# Patient Record
Sex: Female | Born: 1955 | Race: Black or African American | Hispanic: No | Smoking: Former smoker
Health system: Southern US, Community
[De-identification: ages and names within clinical notes are randomized; demographics above are authoritative.]

## PROBLEM LIST (undated history)

## (undated) DIAGNOSIS — I10 Essential (primary) hypertension: Secondary | ICD-10-CM

## (undated) DIAGNOSIS — G5600 Carpal tunnel syndrome, unspecified upper limb: Secondary | ICD-10-CM

## (undated) DIAGNOSIS — B379 Candidiasis, unspecified: Secondary | ICD-10-CM

## (undated) DIAGNOSIS — M1990 Unspecified osteoarthritis, unspecified site: Secondary | ICD-10-CM

## (undated) DIAGNOSIS — E7800 Pure hypercholesterolemia, unspecified: Secondary | ICD-10-CM

## (undated) HISTORY — PX: ECTOPIC PREGNANCY SURGERY: SHX613

## (undated) HISTORY — DX: Unspecified osteoarthritis, unspecified site: M19.90

## (undated) HISTORY — DX: Pure hypercholesterolemia, unspecified: E78.00

## (undated) HISTORY — DX: Carpal tunnel syndrome, unspecified upper limb: G56.00

---

## 1998-01-22 ENCOUNTER — Emergency Department (HOSPITAL_COMMUNITY): Admission: EM | Admit: 1998-01-22 | Discharge: 1998-01-22 | Payer: Self-pay | Admitting: Emergency Medicine

## 2001-10-13 ENCOUNTER — Encounter: Payer: Self-pay | Admitting: Emergency Medicine

## 2001-10-13 ENCOUNTER — Emergency Department (HOSPITAL_COMMUNITY): Admission: EM | Admit: 2001-10-13 | Discharge: 2001-10-13 | Payer: Self-pay | Admitting: Emergency Medicine

## 2003-05-13 ENCOUNTER — Emergency Department (HOSPITAL_COMMUNITY): Admission: EM | Admit: 2003-05-13 | Discharge: 2003-05-13 | Payer: Self-pay

## 2004-09-11 ENCOUNTER — Emergency Department (HOSPITAL_COMMUNITY): Admission: EM | Admit: 2004-09-11 | Discharge: 2004-09-11 | Payer: Self-pay | Admitting: Emergency Medicine

## 2005-01-13 ENCOUNTER — Emergency Department (HOSPITAL_COMMUNITY): Admission: EM | Admit: 2005-01-13 | Discharge: 2005-01-13 | Payer: Self-pay | Admitting: Emergency Medicine

## 2005-06-12 ENCOUNTER — Emergency Department (HOSPITAL_COMMUNITY): Admission: EM | Admit: 2005-06-12 | Discharge: 2005-06-12 | Payer: Self-pay | Admitting: Family Medicine

## 2008-06-21 ENCOUNTER — Emergency Department (HOSPITAL_COMMUNITY): Admission: EM | Admit: 2008-06-21 | Discharge: 2008-06-21 | Payer: Self-pay | Admitting: Emergency Medicine

## 2009-04-12 ENCOUNTER — Ambulatory Visit: Payer: Self-pay | Admitting: Family Medicine

## 2009-04-12 LAB — CONVERTED CEMR LAB
ALT: 15 units/L (ref 0–35)
AST: 17 units/L (ref 0–37)
Albumin: 4.5 g/dL (ref 3.5–5.2)
Alkaline Phosphatase: 66 units/L (ref 39–117)
BUN: 16 mg/dL (ref 6–23)
Basophils Absolute: 0 10*3/uL (ref 0.0–0.1)
Basophils Relative: 0 % (ref 0–1)
CO2: 26 meq/L (ref 19–32)
Calcium: 9.8 mg/dL (ref 8.4–10.5)
Chlamydia, Swab/Urine, PCR: NEGATIVE
Chloride: 103 meq/L (ref 96–112)
Creatinine, Ser: 0.94 mg/dL (ref 0.40–1.20)
Eosinophils Absolute: 0.1 10*3/uL (ref 0.0–0.7)
Eosinophils Relative: 2 % (ref 0–5)
GC Probe Amp, Urine: NEGATIVE
Glucose, Bld: 85 mg/dL (ref 70–99)
HCT: 40.3 % (ref 36.0–46.0)
Hemoglobin: 13.6 g/dL (ref 12.0–15.0)
Lymphocytes Relative: 47 % — ABNORMAL HIGH (ref 12–46)
Lymphs Abs: 2.6 10*3/uL (ref 0.7–4.0)
MCHC: 33.7 g/dL (ref 30.0–36.0)
MCV: 84 fL (ref 78.0–100.0)
Monocytes Absolute: 0.4 10*3/uL (ref 0.1–1.0)
Monocytes Relative: 8 % (ref 3–12)
Neutro Abs: 2.4 10*3/uL (ref 1.7–7.7)
Neutrophils Relative %: 43 % (ref 43–77)
Platelets: 247 10*3/uL (ref 150–400)
Potassium: 4.1 meq/L (ref 3.5–5.3)
RBC: 4.8 M/uL (ref 3.87–5.11)
RDW: 12.9 % (ref 11.5–15.5)
Sed Rate: 25 mm/hr — ABNORMAL HIGH (ref 0–22)
Sodium: 141 meq/L (ref 135–145)
TSH: 1.139 microintl units/mL (ref 0.350–4.500)
Total Bilirubin: 0.3 mg/dL (ref 0.3–1.2)
Total Protein: 7.6 g/dL (ref 6.0–8.3)
WBC: 5.4 10*3/uL (ref 4.0–10.5)

## 2009-04-19 ENCOUNTER — Ambulatory Visit: Payer: Self-pay | Admitting: Internal Medicine

## 2009-04-19 ENCOUNTER — Encounter (INDEPENDENT_AMBULATORY_CARE_PROVIDER_SITE_OTHER): Payer: Self-pay | Admitting: Family Medicine

## 2009-04-19 ENCOUNTER — Ambulatory Visit (HOSPITAL_COMMUNITY): Admission: RE | Admit: 2009-04-19 | Discharge: 2009-04-19 | Payer: Self-pay | Admitting: Family Medicine

## 2009-04-19 LAB — CONVERTED CEMR LAB
Cholesterol: 238 mg/dL — ABNORMAL HIGH (ref 0–200)
HDL: 46 mg/dL (ref >39)
LDL Cholesterol: 173 mg/dL — ABNORMAL HIGH (ref 0–99)
Total CHOL/HDL Ratio: 5.2
Triglycerides: 93 mg/dL (ref <150)
VLDL: 19 mg/dL (ref 0–40)

## 2009-04-29 ENCOUNTER — Ambulatory Visit: Payer: Self-pay | Admitting: Internal Medicine

## 2009-05-23 ENCOUNTER — Ambulatory Visit: Payer: Self-pay | Admitting: Family Medicine

## 2009-05-23 LAB — CONVERTED CEMR LAB
BUN: 16 mg/dL (ref 6–23)
CO2: 26 meq/L (ref 19–32)
Calcium: 10.2 mg/dL (ref 8.4–10.5)
Chloride: 100 meq/L (ref 96–112)
Creatinine, Ser: 0.94 mg/dL (ref 0.40–1.20)
Glucose, Bld: 114 mg/dL — ABNORMAL HIGH (ref 70–99)
Potassium: 3.5 meq/L (ref 3.5–5.3)
Sodium: 139 meq/L (ref 135–145)

## 2009-06-15 ENCOUNTER — Emergency Department (HOSPITAL_COMMUNITY): Admission: EM | Admit: 2009-06-15 | Discharge: 2009-06-16 | Payer: Self-pay | Admitting: Emergency Medicine

## 2009-06-27 ENCOUNTER — Encounter (INDEPENDENT_AMBULATORY_CARE_PROVIDER_SITE_OTHER): Payer: Self-pay | Admitting: Family Medicine

## 2009-06-27 ENCOUNTER — Ambulatory Visit: Payer: Self-pay | Admitting: Family Medicine

## 2009-06-27 LAB — CONVERTED CEMR LAB
BUN: 14 mg/dL (ref 6–23)
CO2: 24 meq/L (ref 19–32)
Calcium: 9.9 mg/dL (ref 8.4–10.5)
Chloride: 103 meq/L (ref 96–112)
Creatinine, Ser: 1.17 mg/dL (ref 0.40–1.20)
Glucose, Bld: 110 mg/dL — ABNORMAL HIGH (ref 70–99)
Potassium: 3.7 meq/L (ref 3.5–5.3)
Sodium: 138 meq/L (ref 135–145)

## 2009-07-21 ENCOUNTER — Ambulatory Visit: Payer: Self-pay | Admitting: Internal Medicine

## 2009-10-18 ENCOUNTER — Ambulatory Visit: Payer: Self-pay | Admitting: Internal Medicine

## 2009-11-09 ENCOUNTER — Ambulatory Visit: Payer: Self-pay | Admitting: Family Medicine

## 2010-04-20 ENCOUNTER — Ambulatory Visit (HOSPITAL_COMMUNITY)
Admission: RE | Admit: 2010-04-20 | Discharge: 2010-04-20 | Payer: Self-pay | Source: Home / Self Care | Attending: Internal Medicine | Admitting: Internal Medicine

## 2010-07-02 LAB — POCT I-STAT, CHEM 8
BUN: 16 mg/dL (ref 6–23)
Calcium, Ion: 1.1 mmol/L — ABNORMAL LOW (ref 1.12–1.32)
Chloride: 101 mEq/L (ref 96–112)
Creatinine, Ser: 1 mg/dL (ref 0.4–1.2)
Glucose, Bld: 114 mg/dL — ABNORMAL HIGH (ref 70–99)
HCT: 40 % (ref 36.0–46.0)
Hemoglobin: 13.6 g/dL (ref 12.0–15.0)
Potassium: 3.5 mEq/L (ref 3.5–5.1)
Sodium: 139 mEq/L (ref 135–145)
TCO2: 30 mmol/L (ref 0–100)

## 2010-07-02 LAB — CBC
HCT: 38.8 % (ref 36.0–46.0)
Hemoglobin: 13.3 g/dL (ref 12.0–15.0)
MCHC: 34.3 g/dL (ref 30.0–36.0)
MCV: 85.3 fL (ref 78.0–100.0)
Platelets: 207 10*3/uL (ref 150–400)
RBC: 4.55 MIL/uL (ref 3.87–5.11)
RDW: 12.5 % (ref 11.5–15.5)
WBC: 7.2 10*3/uL (ref 4.0–10.5)

## 2010-07-02 LAB — DIFFERENTIAL
Basophils Absolute: 0 10*3/uL (ref 0.0–0.1)
Basophils Relative: 1 % (ref 0–1)
Eosinophils Absolute: 0.1 10*3/uL (ref 0.0–0.7)
Eosinophils Relative: 2 % (ref 0–5)
Lymphocytes Relative: 38 % (ref 12–46)
Lymphs Abs: 2.7 10*3/uL (ref 0.7–4.0)
Monocytes Absolute: 0.7 10*3/uL (ref 0.1–1.0)
Monocytes Relative: 10 % (ref 3–12)
Neutro Abs: 3.6 10*3/uL (ref 1.7–7.7)
Neutrophils Relative %: 50 % (ref 43–77)

## 2010-10-07 ENCOUNTER — Inpatient Hospital Stay (INDEPENDENT_AMBULATORY_CARE_PROVIDER_SITE_OTHER)
Admission: RE | Admit: 2010-10-07 | Discharge: 2010-10-07 | Disposition: A | Discharge status: Home / Self Care | Payer: Self-pay | Source: Ambulatory Visit | Attending: Family Medicine | Admitting: Family Medicine

## 2010-10-07 DIAGNOSIS — J069 Acute upper respiratory infection, unspecified: Secondary | ICD-10-CM

## 2011-01-29 ENCOUNTER — Other Ambulatory Visit: Payer: Self-pay | Admitting: Family Medicine

## 2011-04-19 ENCOUNTER — Other Ambulatory Visit (HOSPITAL_COMMUNITY): Payer: Self-pay | Admitting: Family Medicine

## 2011-04-19 DIAGNOSIS — Z1231 Encounter for screening mammogram for malignant neoplasm of breast: Secondary | ICD-10-CM

## 2011-05-18 ENCOUNTER — Ambulatory Visit (HOSPITAL_COMMUNITY)
Admission: RE | Admit: 2011-05-18 | Discharge: 2011-05-18 | Disposition: A | Discharge status: Home / Self Care | Payer: Self-pay | Source: Ambulatory Visit | Attending: Family Medicine | Admitting: Family Medicine

## 2011-05-18 DIAGNOSIS — Z1231 Encounter for screening mammogram for malignant neoplasm of breast: Secondary | ICD-10-CM | POA: Insufficient documentation

## 2011-05-30 ENCOUNTER — Emergency Department (HOSPITAL_COMMUNITY)
Admission: EM | Admit: 2011-05-30 | Discharge: 2011-05-30 | Disposition: A | Discharge status: Home / Self Care | Payer: Self-pay | Source: Home / Self Care | Attending: Emergency Medicine | Admitting: Emergency Medicine

## 2011-05-30 ENCOUNTER — Encounter (HOSPITAL_COMMUNITY): Payer: Self-pay | Admitting: *Deleted

## 2011-05-30 DIAGNOSIS — N760 Acute vaginitis: Secondary | ICD-10-CM

## 2011-05-30 HISTORY — DX: Candidiasis, unspecified: B37.9

## 2011-05-30 HISTORY — DX: Essential (primary) hypertension: I10

## 2011-05-30 LAB — WET PREP, GENITAL: Trich, Wet Prep: NONE SEEN

## 2011-05-30 LAB — POCT URINALYSIS DIP (DEVICE)
Bilirubin Urine: NEGATIVE
Glucose, UA: NEGATIVE mg/dL
Hgb urine dipstick: NEGATIVE
Ketones, ur: NEGATIVE mg/dL
Nitrite: NEGATIVE
Protein, ur: NEGATIVE mg/dL
Specific Gravity, Urine: 1.01 (ref 1.005–1.030)
Urobilinogen, UA: 0.2 mg/dL (ref 0.0–1.0)
pH: 7 (ref 5.0–8.0)

## 2011-05-30 MED ORDER — METRONIDAZOLE 500 MG PO TABS: 500.0000 mg | ORAL_TABLET | Freq: Two times a day (BID) | ORAL | Status: AC

## 2011-05-30 MED ORDER — FLUCONAZOLE 150 MG PO TABS: ORAL_TABLET | ORAL | Status: AC

## 2011-05-30 NOTE — ED Notes (Signed)
Pt  reportts  Symptoms  Of a  Vaginal  Discharge  With itching  And      Low  abd  Pressure  As  Well  - pt  Reports  The  Symptoms   For   Several  Days  -  The pt is  Ambulatory upright  With a  Steady  Fluid  Gait

## 2011-05-30 NOTE — Discharge Instructions (Signed)
Take the medication as written. Give [place] a working phone number so that we can contact you if needed. Refrain from sexual contact until you know your results and your partner(s) are treated. Return if you get worse, have a fever >100.4, or for any concerns. Make sure you take your blood pressure medication regularly. Fill it tomorrow. Uncontrolled hypertension dramatically increases your risk of stroke, heart attack, eye, and kidney damage. Go immediately to the ER if you have chest pain, a headache, trouble breathing, if you feel like you are about to pass out, if you do pass out, or for any other concerns.

## 2011-05-30 NOTE — ED Provider Notes (Signed)
History     CSN: [id number]  Arrival date & time 05/30/11  1836   First MD Initiated Contact with Patient 05/30/11 1910      No chief complaint on file.   (Consider location/radiation/quality/duration/timing/severity/associated sxs/prior treatment) HPI Comments: Patient with vaginal itching, nonodorous, thick vaginal discharge starting several days ago. Reports dysuria, but No urinary urgency, frequency, hematuria, oderous or cloudy urine. No genital blisters, nausea, vomiting, fevers, back pain. She reports intermittent nonradiating, lower midline, abdominal pressure after urinating. No recent antibiotic use. Patient is sexually active- had intercourse with her husband last week, from whom she is separated. Did not use protection. Has a history of yeast infections, and a history of an unknown sexually transmitted disease. No history of BV, trichomoniasis, herpes, syphilis, HIV. Previous labs negative for gonorrhea or Chlamydia.  Patient is a 56 y.o. female presenting with vaginal discharge. The history is provided by the patient.  Vaginal Discharge This is a new problem. The current episode started more than 2 days ago. The problem occurs constantly. The problem has not changed since onset.Pertinent negatives include no chest pain and no abdominal pain. The symptoms are aggravated by nothing. The symptoms are relieved by nothing. She has tried nothing for the symptoms. The treatment provided no relief.    No past medical history on file.  No past surgical history on file.  No family history on file.  History  Substance Use Topics  . Smoking status: Not on file  . Smokeless tobacco: Not on file  . Alcohol Use: Not on file    OB History    No data available      Review of Systems  Constitutional: Negative for fever.  HENT: Negative for sore throat and mouth sores.   Eyes: Negative for redness.  Respiratory: Negative for cough.   Cardiovascular: Negative for chest pain.    Gastrointestinal: Negative for nausea, vomiting and abdominal pain.  Genitourinary: Positive for vaginal discharge. Negative for dysuria, urgency, frequency, hematuria, flank pain, vaginal bleeding and vaginal pain.  Musculoskeletal: Negative for back pain.  Skin: Negative for rash.    Allergies  Review of patient's allergies indicates not on file.  Home Medications  No current outpatient prescriptions on file.  There were no vitals taken for this visit.  Physical Exam  Nursing note and vitals reviewed. Constitutional: She is oriented to person, place, and time. She appears well-developed and well-nourished. No distress.  HENT:  Head: Normocephalic and atraumatic.  Eyes: EOM are normal.  Neck: Normal range of motion.  Cardiovascular: Normal rate.   Pulmonary/Chest: Effort normal.  Abdominal: Soft. Bowel sounds are normal. She exhibits no distension. There is no tenderness. There is no rebound and no guarding.  Genitourinary: Uterus normal. Pelvic exam was performed with patient supine. There is no rash on the right labia. There is no rash on the left labia. Uterus is not tender. Cervix exhibits no motion tenderness and no friability. Right adnexum displays no mass, no tenderness and no fullness. Left adnexum displays no mass, no tenderness and no fullness. No erythema, tenderness or bleeding around the vagina. No foreign body around the vagina. Vaginal discharge found.       Thin white nonoderous  vaginal d/c.Chaperone present during exam  Musculoskeletal: Normal range of motion.  Neurological: She is alert and oriented to person, place, and time.  Skin: Skin is warm and dry.  Psychiatric: She has a normal mood and affect. Her behavior is normal. Judgment and thought content normal.  ED Course  Procedures (including critical care time)  Labs Reviewed - No data to display No results found.   No diagnosis found.  Results for orders placed during the hospital encounter of  05/30/11  POCT URINALYSIS DIP (DEVICE)      Component Value Range   Glucose, UA NEGATIVE  NEGATIVE (mg/dL)   Bilirubin Urine NEGATIVE  NEGATIVE    Ketones, ur NEGATIVE  NEGATIVE (mg/dL)   Specific Gravity, Urine 1.010  1.005 - 1.030    Hgb urine dipstick NEGATIVE  NEGATIVE    pH 7.0  5.0 - 8.0    Protein, ur NEGATIVE  NEGATIVE (mg/dL)   Urobilinogen, UA 0.2  0.0 - 1.0 (mg/dL)   Nitrite NEGATIVE  NEGATIVE    Leukocytes, UA MODERATE (*) NEGATIVE      MDM  Previous labs reviewed. No history of gonorrhea Chlamydia. Udip noted. Feel that this is probably from the vaginal discharge.  Cell phone [phone]. Pt states to call this.   H&P most c/w BV vs trich. Sent off GC/chlamydia, wet prep, HIV, RPR. Will not treat empirically now. Will send home with flagyl, diflucan if her symptoms do not resolve with this.. Advised pt to refrain from sexual contact until she  knows lab results, symptoms resolve, and partner(s) are treated. Pt provided working phone number. Pt agrees.     Luiz Blare, MD 05/30/11 2251

## 2011-05-31 LAB — GC/CHLAMYDIA PROBE AMP, GENITAL
Chlamydia, DNA Probe: NEGATIVE
GC Probe Amp, Genital: NEGATIVE

## 2011-05-31 NOTE — ED Notes (Signed)
GC/Chlamydia neg., Wet prep: Few clue cells, yeast TNTC, many WBC's. Pt. adequately treated with Diflucan and Flagyl. Vassie Moselle 05/31/2011

## 2012-10-29 ENCOUNTER — Other Ambulatory Visit: Payer: Self-pay

## 2012-10-29 DIAGNOSIS — Z1231 Encounter for screening mammogram for malignant neoplasm of breast: Secondary | ICD-10-CM

## 2012-11-24 ENCOUNTER — Ambulatory Visit: Payer: Self-pay

## 2012-11-27 ENCOUNTER — Ambulatory Visit
Admission: RE | Admit: 2012-11-27 | Discharge: 2012-11-27 | Disposition: A | Discharge status: Home / Self Care | Payer: BC Managed Care – PPO | Source: Ambulatory Visit

## 2012-11-27 DIAGNOSIS — Z1231 Encounter for screening mammogram for malignant neoplasm of breast: Secondary | ICD-10-CM

## 2013-02-12 ENCOUNTER — Emergency Department (HOSPITAL_COMMUNITY)
Admission: EM | Admit: 2013-02-12 | Discharge: 2013-02-12 | Disposition: A | Discharge status: Home / Self Care | Payer: BC Managed Care – PPO | Source: Home / Self Care | Attending: Family Medicine | Admitting: Family Medicine

## 2013-02-12 ENCOUNTER — Encounter (HOSPITAL_COMMUNITY): Payer: Self-pay | Admitting: Emergency Medicine

## 2013-02-12 DIAGNOSIS — J069 Acute upper respiratory infection, unspecified: Secondary | ICD-10-CM

## 2013-02-12 LAB — POCT RAPID STREP A: Streptococcus, Group A Screen (Direct): NEGATIVE

## 2013-02-12 MED ORDER — IPRATROPIUM BROMIDE 0.03 % NA SOLN: 2.0000 | Freq: Two times a day (BID) | NASAL | Status: DC

## 2013-02-12 MED ORDER — AZITHROMYCIN 250 MG PO TABS: 250.0000 mg | ORAL_TABLET | Freq: Every day | ORAL | Status: DC

## 2013-02-12 MED ORDER — GUAIFENESIN-CODEINE 100-10 MG/5ML PO SOLN: 5.0000 mL | Freq: Three times a day (TID) | ORAL | Status: DC | PRN

## 2013-02-12 NOTE — ED Notes (Signed)
Pt c/o cold sxs onset yest Sxs include: chills, facial pressure, runny nose, congestion, ST Denies: SOB, wheezing, f/v/n/d Pt is alert w/no signs of acute distress.

## 2013-02-12 NOTE — Discharge Instructions (Signed)
Thank you for coming in today. Use the cough medication as needed. Use Atrovent nasal spray. Use ibuprofen for pain fever and sore throat. If not getting better take azithromycin. Call or go to the emergency room if you get worse, have trouble breathing, have chest pains, or palpitations.   Cough, Adult  A cough is a reflex that helps clear your throat and airways. It can help heal the body or may be a reaction to an irritated airway. A cough may only last 2 or 3 weeks (acute) or may last more than 8 weeks (chronic).  CAUSES Acute cough:  Viral or bacterial infections. Chronic cough:  Infections.  Allergies.  Asthma.  Post-nasal drip.  Smoking.  Heartburn or acid reflux.  Some medicines.  Chronic lung problems (COPD).  Cancer. SYMPTOMS   Cough.  Fever.  Chest pain.  Increased breathing rate.  High-pitched whistling sound when breathing (wheezing).  Colored mucus that you cough up (sputum). TREATMENT   A bacterial cough may be treated with antibiotic medicine.  A viral cough must run its course and will not respond to antibiotics.  Your caregiver may recommend other treatments if you have a chronic cough. HOME CARE INSTRUCTIONS   Only take over-the-counter or prescription medicines for pain, discomfort, or fever as directed by your caregiver. Use cough suppressants only as directed by your caregiver.  Use a cold steam vaporizer or humidifier in your bedroom or home to help loosen secretions.  Sleep in a semi-upright position if your cough is worse at night.  Rest as needed.  Stop smoking if you smoke. SEEK IMMEDIATE MEDICAL CARE IF:   You have pus in your sputum.  Your cough starts to worsen.  You cannot control your cough with suppressants and are losing sleep.  You begin coughing up blood.  You have difficulty breathing.  You develop pain which is getting worse or is uncontrolled with medicine.  You have a fever. MAKE SURE YOU:    Understand these instructions.  Will watch your condition.  Will get help right away if you are not doing well or get worse. Document Released: 09/22/2010 Document Revised: 06/18/2011 Document Reviewed: 09/22/2010 [facility] Patient Information 2014 [place], [place].

## 2013-02-12 NOTE — ED Provider Notes (Signed)
Autumn Stuart is a 57 y.o. female who presents to Urgent Care today for cough congestion runny nose present for 2 days. This is associated with some body aches mild sore throat. She's tried Alka-Seltzer which made her vomit. Aside from that one episode of vomiting she has not had any further nausea vomiting or diarrhea. She is currently being evaluated for rheumatoid arthritis by a rheumatologist. She's not currently taking any medications chronically aside from amlodipine. She's well otherwise   Past Medical History  Diagnosis Date  . Hypertension   . Yeast infection    History  Substance Use Topics  . Smoking status: Never Smoker   . Smokeless tobacco: Not on file  . Alcohol Use: No   ROS as above Medications reviewed. No current facility-administered medications for this encounter.   Current Outpatient Prescriptions  Medication Sig Dispense Refill  . amLODipine (NORVASC) 5 MG tablet Take 5 mg by mouth daily.      Marland Kitchen azithromycin (ZITHROMAX) 250 MG tablet Take 1 tablet (250 mg total) by mouth daily. Take first 2 tablets together, then 1 every day until finished.  6 tablet  0  . guaiFENesin-codeine 100-10 MG/5ML syrup Take 5 mLs by mouth 3 (three) times daily as needed for cough.  120 mL  0  . ipratropium (ATROVENT) 0.03 % nasal spray Place 2 sprays into the nose every 12 (twelve) hours.  30 mL  1    Exam:  BP 166/92  Pulse 83  Temp(Src) 98.5 F (36.9 C) (Oral)  Resp 18  SpO2 98% Gen: Well NAD HEENT: EOMI,  MMM, posterior pharynx with cobblestoning. Tympanic membranes are normal appearing bilaterally. Lungs: CTABL Nl WOB Heart: RRR no MRG Abd: NABS, NT, ND Exts: Non edematous BL  LE, warm and well perfused.   Results for orders placed during the hospital encounter of 02/12/13 (from the past 24 hour(s))  POCT RAPID STREP A ([facility] ONLY)     Status: None   Collection Time    02/12/13  3:00 PM      Result Value Range   Streptococcus, Group A Screen (Direct) NEGATIVE   NEGATIVE   No results found.  Assessment and Plan: 57 y.o. female with viral URI with postnasal drip. Plan for symptomatic management with Atrovent and codeine containing cough medication. Additional prescribe azithromycin if patient does not improve. Followup if not improving. Discussed warning signs or symptoms. Please see discharge instructions. Patient expresses understanding.      Rodolph Bong, MD 02/12/13 [phone]

## 2013-02-14 LAB — CULTURE, GROUP A STREP

## 2013-12-03 ENCOUNTER — Ambulatory Visit (INDEPENDENT_AMBULATORY_CARE_PROVIDER_SITE_OTHER): Payer: No Typology Code available for payment source | Admitting: Family Medicine

## 2013-12-03 ENCOUNTER — Ambulatory Visit (INDEPENDENT_AMBULATORY_CARE_PROVIDER_SITE_OTHER): Payer: No Typology Code available for payment source

## 2013-12-03 VITALS — BP 162/100 | HR 60 | Temp 98.1°F | Resp 16 | Ht 60.0 in | Wt 157.0 lb

## 2013-12-03 DIAGNOSIS — M545 Low back pain, unspecified: Secondary | ICD-10-CM

## 2013-12-03 DIAGNOSIS — M810 Age-related osteoporosis without current pathological fracture: Secondary | ICD-10-CM | POA: Insufficient documentation

## 2013-12-03 DIAGNOSIS — I10 Essential (primary) hypertension: Secondary | ICD-10-CM | POA: Insufficient documentation

## 2013-12-03 DIAGNOSIS — E785 Hyperlipidemia, unspecified: Secondary | ICD-10-CM | POA: Insufficient documentation

## 2013-12-03 DIAGNOSIS — M546 Pain in thoracic spine: Secondary | ICD-10-CM

## 2013-12-03 LAB — POCT CBC
Granulocyte percent: 63.3 %G (ref 37–80)
HCT, POC: 43 % (ref 37.7–47.9)
Hemoglobin: 13.7 g/dL (ref 12.2–16.2)
Lymph, poc: 1.8 (ref 0.6–3.4)
MCH, POC: 27.7 pg (ref 27–31.2)
MCHC: 31.7 g/dL — AB (ref 31.8–35.4)
MCV: 87.1 fL (ref 80–97)
MID (cbc): 0.2 (ref 0–0.9)
MPV: 8.8 fL (ref 0–99.8)
POC Granulocyte: 3.5 (ref 2–6.9)
POC LYMPH PERCENT: 32.2 %L (ref 10–50)
POC MID %: 4.5 %M (ref 0–12)
Platelet Count, POC: 235 10*3/uL (ref 142–424)
RBC: 4.94 M/uL (ref 4.04–5.48)
RDW, POC: 13.3 %
WBC: 5.5 10*3/uL (ref 4.6–10.2)

## 2013-12-03 LAB — POCT URINALYSIS DIPSTICK
Bilirubin, UA: NEGATIVE
Blood, UA: NEGATIVE
Glucose, UA: NEGATIVE
Ketones, UA: NEGATIVE
Leukocytes, UA: NEGATIVE
Nitrite, UA: NEGATIVE
Protein, UA: NEGATIVE
Spec Grav, UA: 1.015
Urobilinogen, UA: 0.2
pH, UA: 6.5

## 2013-12-03 LAB — POCT UA - MICROSCOPIC ONLY
Casts, Ur, LPF, POC: NEGATIVE
Crystals, Ur, HPF, POC: NEGATIVE
Mucus, UA: NEGATIVE
RBC, urine, microscopic: NEGATIVE
Yeast, UA: NEGATIVE

## 2013-12-03 MED ORDER — HYDROCODONE-ACETAMINOPHEN 5-325 MG PO TABS: 1.0000 | ORAL_TABLET | Freq: Four times a day (QID) | ORAL | Status: DC | PRN

## 2013-12-03 MED ORDER — PREDNISONE 20 MG PO TABS: 40.0000 mg | ORAL_TABLET | Freq: Every day | ORAL | Status: DC

## 2013-12-03 NOTE — Progress Notes (Addendum)
This chart was scribed for Robyn Haber, MD by Ladene Artist, ED Scribe. The patient was seen in room 3. Patient's care was started at 10:09 AM.  Patient ID: Autumn Stuart MRN: [id number], DOB: 05-Feb-1956, 58 y.o. Date of Encounter: 12/03/2013, 10:09 AM  Primary Physician: Becky Sax, MD  Chief Complaint  Patient presents with   Back Pain    x 4 days    HPI: 58 y.o. year old female with history below presents with gradually worsening of R-sided lower back pain over the past 4 days. Pt reports associated SOB last night, nausea, mild weakness in bilateral legs, substernal chest wall tenderess. She denies cough, emesis, abdominal pain, fever.  Pain is exacerbated with raising her R arm and with deep breaths. Pt states that she can not sleep, sit or stand for long periods of time due to pain. She denies fall or injury. Pt has tried applying an ice pack, heating pad, taking Motrin and Gas-X with no relief.   Patient was diagnosed with osteoporosis last week. She started on Fosamax and vitamin D. 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   Past Medical History  Diagnosis Date   Hypertension    Yeast infection    Arthritis    High cholesterol      Home Meds: Prior to Admission medications   Medication Sig Start Date End Date Taking? Authorizing Provider  alendronate (FOSAMAX) 70 MG tablet Take 70 mg by mouth once a week. Take with a full glass of water on an empty stomach.   Yes Historical Provider, MD  diclofenac sodium (VOLTAREN) 1 % GEL Apply topically 4 (four) times daily.   Yes Historical Provider, MD  rosuvastatin (CRESTOR) 10 MG tablet Take 10 mg by mouth daily.   Yes Historical Provider, MD  amLODipine (NORVASC) 5 MG tablet Take 5 mg by mouth daily.    Historical Provider, MD  ipratropium (ATROVENT) 0.03 % nasal spray Place 2 sprays into the nose every 12 (twelve) hours. 02/12/13   Gregor Hams, MD     Allergies: No Known Allergies  History   Social History   Marital Status: Legally Separated    Spouse Name: N/A    Number of Children: N/A   Years of Education: N/A   Occupational History   Not on file.   Social History Main Topics   Smoking status: Never Smoker    Smokeless tobacco: Not on file   Alcohol Use: No   Drug Use: No   Sexual Activity: Not on file   Other Topics Concern   Not on file   Social History Narrative   No narrative on file     Review of Systems: Constitutional: negative for chills, fever, night sweats, weight changes, or fatigue  HEENT: negative for vision changes, hearing loss, congestion, rhinorrhea, ST, epistaxis, or sinus pressure Cardiovascular: negative for chest pain or palpitations Respiratory: negative for hemoptysis, wheezing or cough, +shortness of breath Abdominal: negative for abdominal pain, vomiting, diarrhea, or constipation, +nausea  Musc: +back pain Dermatological: negative for rash Neurologic: negative for headache, dizziness, or syncope, +weakness  All other systems reviewed and are otherwise negative with the exception to those above and in the HPI.   Physical Exam:  Patient is tearful when being interviewed is clearly in significant degree of pain Blood pressure 162/100, pulse 60, temperature 98.1 F (36.7 C), temperature source Oral, resp. rate 16, height 5' (  1.524 m), weight 157 lb (71.215 kg), SpO2 100.00%., Body mass index is 30.66 kg/(m^2). General: Well developed, well nourished, in no acute distress. Head: Normocephalic, atraumatic, eyes without discharge, sclera non-icteric, nares are without discharge. Bilateral auditory canals clear, TM's are without perforation, pearly grey and translucent with reflective cone of light bilaterally. Oral cavity moist, posterior pharynx without exudate, erythema, peritonsillar abscess, or post nasal drip.  Neck: Supple. No thyromegaly. Full ROM. No lymphadenopathy. Lungs:  Clear bilaterally to auscultation without wheezes, rales, or rhonchi. Breathing is unlabored. And she's tender over the spinous process of the eighth or ninth thoracic vertebrae as well as the right paraspinal and infraclavicular regions of her back. Heart: RRR with S1 S2. No murmurs, rubs, or gallops appreciated. Abdomen: Soft, non-tender, non-distended with normoactive bowel sounds. No hepatomegaly. No rebound/guarding. No obvious abdominal masses. Msk: Strength and tone normal for age.  Extremities/Skin: Warm and dry. No clubbing or cyanosis. No edema. No rashes or suspicious lesions. Neuro: Alert and oriented X 3. Moves all extremities spontaneously. Gait is normal. CNII-XII grossly in tact. Psych:  Responds to questions appropriately with a normal affect.   [facility] reading (PRIMARY) by  Dr. Joseph Art:  CXR and T-spine.  Neg except for mild arthritis changes and slight decrease in T*8-9 vertebral height Results for orders placed in visit on 12/03/13  POCT CBC      Result Value Ref Range   WBC 5.5  4.6 - 10.2 K/uL   Lymph, poc 1.8  0.6 - 3.4   POC LYMPH PERCENT 32.2  10 - 50 %L   MID (cbc) 0.2  0 - 0.9   POC MID % 4.5  0 - 12 %M   POC Granulocyte 3.5  2 - 6.9   Granulocyte percent 63.3  37 - 80 %G   RBC 4.94  4.04 - 5.48 M/uL   Hemoglobin 13.7  12.2 - 16.2 g/dL   HCT, POC 43.0  37.7 - 47.9 %   MCV 87.1  80 - 97 fL   MCH, POC 27.7  27 - 31.2 pg   MCHC 31.7 (*) 31.8 - 35.4 g/dL   RDW, POC 13.3     Platelet Count, POC 235  142 - 424 K/uL   MPV 8.8  0 - 99.8 fL  POCT URINALYSIS DIPSTICK      Result Value Ref Range   Color, UA yellow     Clarity, UA hazy     Glucose, UA neg     Bilirubin, UA neg     Ketones, UA neg     Spec Grav, UA 1.015     Blood, UA neg     pH, UA 6.5     Protein, UA neg     Urobilinogen, UA 0.2     Nitrite, UA neg     Leukocytes, UA Negative    POCT UA - MICROSCOPIC ONLY      Result Value Ref Range   WBC, Ur, HPF, POC 0-1     RBC, urine, microscopic neg      Bacteria, U Microscopic small     Mucus, UA neg     Epithelial cells, urine per micros 20-25     Crystals, Ur, HPF, POC neg     Casts, Ur, LPF, POC neg     Yeast, UA neg          ASSESSMENT AND PLAN:  58 y.o. year old female with   1. Right-sided low back pain without sciatica  Signed, Robyn Haber, MD 12/03/2013 10:10 AM

## 2013-12-03 NOTE — Patient Instructions (Signed)
Please return on Saturday if you are not better.

## 2013-12-05 ENCOUNTER — Telehealth: Payer: Self-pay

## 2013-12-05 NOTE — Telephone Encounter (Signed)
Patient was seen by Dr. Carlean Jews.   She continues to have back pain even with the pain medication.  Please call  [phone]

## 2013-12-05 NOTE — Telephone Encounter (Signed)
Spoke with pt. Advised to give the prednisone a little more time to work. She says the pain does ease a little with the Norco but that it returns when the pain medicine wears off. Advised to finish the prednisone and to CB if still no relief.

## 2014-01-14 ENCOUNTER — Ambulatory Visit (INDEPENDENT_AMBULATORY_CARE_PROVIDER_SITE_OTHER): Payer: No Typology Code available for payment source | Admitting: Obstetrics & Gynecology

## 2014-01-14 ENCOUNTER — Encounter: Payer: Self-pay | Admitting: Obstetrics & Gynecology

## 2014-01-14 ENCOUNTER — Other Ambulatory Visit: Payer: Self-pay

## 2014-01-14 VITALS — Temp 97.7°F | Ht 61.0 in | Wt 158.0 lb

## 2014-01-14 DIAGNOSIS — N950 Postmenopausal bleeding: Secondary | ICD-10-CM

## 2014-01-14 NOTE — Patient Instructions (Signed)
Endometrial Biopsy Endometrial biopsy is a procedure in which a tissue sample is taken from inside the uterus. The tissue sample is then looked at under a microscope to see if the tissue is normal or abnormal. The endometrium is the lining of the uterus. This procedure helps determine where you are in your menstrual cycle and how hormone levels are affecting the lining of the uterus. This procedure may also be used to evaluate uterine bleeding or to diagnose endometrial cancer, tuberculosis, polyps, or inflammatory conditions.  LET YOUR HEALTH CARE PROVIDER KNOW ABOUT:  Any allergies you have.  All medicines you are taking, including vitamins, herbs, eye drops, creams, and over-the-counter medicines.  Previous problems you or members of your family have had with the use of anesthetics.  Any blood disorders you have.  Previous surgeries you have had.  Medical conditions you have.  Possibility of pregnancy. RISKS AND COMPLICATIONS Generally, this is a safe procedure. However, as with any procedure, complications can occur. Possible complications include:  Bleeding.  Pelvic infection.  Puncture of the uterine wall with the biopsy device (rare). BEFORE THE PROCEDURE   Keep a record of your menstrual cycles as directed by your health care provider. You may need to schedule your procedure for a specific time in your cycle.  You may want to bring a sanitary pad to wear home after the procedure.  Arrange for someone to drive you home after the procedure if you will be given a medicine to help you relax (sedative). PROCEDURE   You may be given a sedative to relax you.  You will lie on an exam table with your feet and legs supported as in a pelvic exam.  Your health care provider will insert an instrument (speculum) into your vagina to see your cervix.  Your cervix will be cleansed with an antiseptic solution. A medicine (local anesthetic) will be used to numb the cervix.  A forceps  instrument (tenaculum) will be used to hold your cervix steady for the biopsy.  A thin, rodlike instrument (uterine sound) will be inserted through your cervix to determine the length of your uterus and the location where the biopsy sample will be removed.  A thin, flexible tube (catheter) will be inserted through your cervix and into the uterus. The catheter is used to collect the biopsy sample from your endometrial tissue.  The catheter and speculum will then be removed, and the tissue sample will be sent to a lab for examination. AFTER THE PROCEDURE  You will rest in a recovery area until you are ready to go home.  You may have mild cramping and a small amount of vaginal bleeding for a few days after the procedure. This is normal.  Make sure you find out how to get your test results. Document Released: 07/27/2004 Document Revised: 11/26/2012 Document Reviewed: 09/10/2012 [employer] Patient Information 2015 [employer]. This information is not intended to replace advice given to you by your health care provider. Make sure you discuss any questions you have with your health care provider.  

## 2014-01-15 LAB — WET PREP BY MOLECULAR PROBE
CANDIDA SPECIES: NEGATIVE
Gardnerella vaginalis: NEGATIVE
TRICHOMONAS VAG: NEGATIVE

## 2014-01-17 NOTE — Progress Notes (Signed)
Subjective:    Autumn Stuart is a 58 y.o.post-menopausal female who presents for concerns regarding vaginal bleeding. She has been menopausal for 10 years. She gives a 1 yr h/o postcoital bleeding.  She denies any associated pain, h/o uterine fibroids,h/o abnormal Pap smears, abnormal vaginal discharge.  There is a remote h/o a STI.  Menstrual History: OB History   Grav Para Term Preterm Abortions TAB SAB Ect Mult Living   0 0 0 0 0 0 0 0 0 0           Patient Active Problem List   Diagnosis Date Noted  . Essential hypertension, benign 12/03/2013  . Hyperlipidemia 12/03/2013  . Osteoporosis, unspecified 12/03/2013   Past Medical History  Diagnosis Date  . Hypertension   . Yeast infection   . Arthritis   . High cholesterol     Past Surgical History  Procedure Laterality Date  . Ectopic pregnancy surgery      Current outpatient prescriptions:alendronate (FOSAMAX) 70 MG tablet, Take 70 mg by mouth once a week. Take with a full glass of water on an empty stomach., Disp: , Rfl: ;  diclofenac sodium (VOLTAREN) 1 % GEL, Apply topically 4 (four) times daily., Disp: , Rfl: ;  HYDROcodone-acetaminophen (NORCO) 5-325 MG per tablet, Take 1 tablet by mouth every 6 (six) hours as needed for moderate pain., Disp: 15 tablet, Rfl: 0 rosuvastatin (CRESTOR) 10 MG tablet, Take 10 mg by mouth daily., Disp: , Rfl: ;  ipratropium (ATROVENT) 0.03 % nasal spray, Place 2 sprays into the nose every 12 (twelve) hours., Disp: 30 mL, Rfl: 1 No Known Allergies  History  Substance Use Topics  . Smoking status: Former Research scientist (life sciences)  . Smokeless tobacco: Former Systems developer    Quit date: 04/09/1993  . Alcohol Use: No    Family History  Problem Relation Age of Onset  . Heart disease Mother   . Hypertension Mother   . High Cholesterol Mother   . Thyroid disease Mother   . Heart disease Father   . Diabetes Sister   . High Cholesterol Sister   . Hypertension Sister   . Thyroid disease Sister   . Cancer Brother   .  Hypertension Brother   . High Cholesterol Brother      Review of Systems Constitutional: negative for anorexia and weight loss Gastrointestinal: positive for abdominal pain and change in bowel habits Genitourinary:negative for genital lesions and vaginal discharge    Objective:    BP 116/74  Pulse 68  Temp(Src) 97.8 F (36.6 C) (Oral)  Ht 5\' 5"  (1.651 m)  Wt 74.39 kg (164 lb)  BMI 27.29 kg/m2  LMP 07/13/2013 General:   alert  Skin:   no rash or abnormalities  Lungs:   clear to auscultation bilaterally  Heart:   regular rate and rhythm, S1, S2 normal, no murmur, click, rub or gallop  Breasts:   normal without suspicious masses, skin or nipple changes or axillary nodes  Abdomen:  normal findings: no organomegaly, soft, non-tender and no hernia  Pelvis:  External genitalia: normal general appearance Urinary system: urethral meatus normal and bladder without fullness, nontender Vaginal: normal without tenderness, induration or masses Cervix: normal appearance Adnexa: normal bimanual exam Uterus: anteverted and non-tender, normal size    Lab Review  Labs reviewed no Radiologic studies reviewed no  Endometrial Biopsy Procedure Note  Pre-operative Diagnosis: Postmenopausal, postcoital bleeding  Post-operative Diagnosis: same  Indications: abnormal uterine bleeding  Procedure Details     The risks (including  infection, bleeding, pain, and uterine perforation) and benefits of the procedure were explained to the patient and Written informed consent was obtained.    The patient was placed in the dorsal lithotomy position.  Bimanual exam showed the uterus to be in the neutral position.  A Graves' speculum inserted in the vagina, and the cervix prepped with povidone iodine.  Endocervical curettage with a Kevorkian curette was not performed.   A sharp tenaculum was applied to the anterior lip of the cervix for stabilization.  The external cervical os was stenotic.  A paracervical  block was administered.  The fibrotic area was incised with a scalpel.  A sterile uterine sound was used to sound the uterus to a depth of 7cm.  A Pipelle endometrial aspirator was used to sample the endometrium.  Sample was sent for pathologic examination.  Condition: Stable  Complications: None  Plan:  The patient was advised to call for any fever or for prolonged or severe pain or bleeding. She was advised to use OTC analgesics as needed for mild to moderate pain. She was advised to avoid vaginal intercourse for 48 hours or until the bleeding has completely stopped.    Assessment:    Postmenopausal/Postcoital bleeding--DDX: infection, atrophy, endometrial pathology   Plan:     Orders Placed This Encounter  Procedures  . WET PREP BY MOLECULAR PROBE  . US Pelvis Complete    Standing Status: Future     Number of Occurrences:      Standing Expiration Date: 03/17/2015    Order Specific Question:  Reason for Exam (SYMPTOM  OR DIAGNOSIS REQUIRED)    Answer:  Post menopausal bleeding    Order Specific Question:  Preferred imaging location?    Answer:  [facility]  . US Transvaginal Non-OB    [facility]       [place]     [phone]    Standing Status: Future     Number of Occurrences:      Standing Expiration Date: 03/17/2015    Order Specific Question:  Reason for Exam (SYMPTOM  OR DIAGNOSIS REQUIRED)    Answer:  Post Menopausal Bleeding    Order Specific Question:  Preferred imaging location?    Answer:  [facility]      Possible options include: colposcopy Follow up after the U/S

## 2014-01-18 LAB — PAP IG, CT-NG NAA, HPV HIGH-RISK
Chlamydia Probe Amp: NEGATIVE
GC Probe Amp: NEGATIVE
HPV DNA High Risk: NOT DETECTED

## 2014-01-20 ENCOUNTER — Other Ambulatory Visit (HOSPITAL_COMMUNITY): Payer: BC Managed Care – PPO

## 2014-01-22 ENCOUNTER — Ambulatory Visit (HOSPITAL_COMMUNITY)
Admission: RE | Admit: 2014-01-22 | Discharge: 2014-01-22 | Disposition: A | Discharge status: Home / Self Care | Payer: No Typology Code available for payment source | Source: Ambulatory Visit | Attending: Obstetrics & Gynecology | Admitting: Obstetrics & Gynecology

## 2014-01-22 DIAGNOSIS — N950 Postmenopausal bleeding: Secondary | ICD-10-CM | POA: Insufficient documentation

## 2014-01-22 DIAGNOSIS — D251 Intramural leiomyoma of uterus: Secondary | ICD-10-CM | POA: Insufficient documentation

## 2014-01-25 ENCOUNTER — Encounter: Payer: Self-pay | Admitting: Obstetrics & Gynecology

## 2014-01-25 ENCOUNTER — Ambulatory Visit (INDEPENDENT_AMBULATORY_CARE_PROVIDER_SITE_OTHER): Payer: No Typology Code available for payment source | Admitting: Obstetrics & Gynecology

## 2014-01-25 VITALS — BP 146/88 | HR 56 | Temp 97.9°F | Wt 155.8 lb

## 2014-01-25 DIAGNOSIS — Z3202 Encounter for pregnancy test, result negative: Secondary | ICD-10-CM

## 2014-01-25 DIAGNOSIS — Z01812 Encounter for preprocedural laboratory examination: Secondary | ICD-10-CM

## 2014-01-25 DIAGNOSIS — N930 Postcoital and contact bleeding: Secondary | ICD-10-CM

## 2014-01-25 LAB — POCT URINE PREGNANCY: PREG TEST UR: NEGATIVE

## 2014-01-27 NOTE — Progress Notes (Signed)
Colposcopy Procedure Note  Indications: Postcoital bleeding Procedure Details  The risks and benefits of the procedure and Written informed consent obtained.  A time-out was performed confirming the patient, procedure and allergy status  Speculum placed in vagina and excellent visualization of cervix achieved, cervix swabbed x 3 with acetic acid solution.  Findings: Cervix: no visible lesions; SCJ visualized 360 degrees without lesions.      Physical Exam   Specimens: none  Complications: none.

## 2014-01-27 NOTE — Progress Notes (Signed)
Patient ID: Autumn Stuart, female   DOB: 12/01/1955, 58 y.o.   MRN: [id number]  Chief Complaint  Patient presents with  . Follow-up    HPI Autumn Stuart is a 58 y.o. female.  The recent pap was normal.  The endometrial biopsy pathology was benign.  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  HPI  Past Medical History  Diagnosis Date  . Hypertension   . Yeast infection   . Arthritis   . High cholesterol     Past Surgical History  Procedure Laterality Date  . Ectopic pregnancy surgery      Family History  Problem Relation Age of Onset  . Heart disease Mother   . Hypertension Mother   . High Cholesterol Mother   . Thyroid disease Mother   . Heart disease Father   . Diabetes Sister   . High Cholesterol Sister   . Hypertension Sister   . Thyroid disease Sister   . Cancer Brother   . Hypertension Brother   . High Cholesterol Brother     Social History History  Substance Use Topics  . Smoking status: Former Research scientist (life sciences)  . Smokeless tobacco: Former Systems developer    Quit date: 04/09/1993  . Alcohol Use: No    No Known Allergies  Current Outpatient Prescriptions  Medication Sig Dispense Refill  . alendronate (FOSAMAX) 70 MG tablet Take 70 mg by mouth once a week. Take with a full glass of water on an empty stomach.      . diclofenac sodium (VOLTAREN) 1 % GEL Apply topically 4 (four) times daily.      . rosuvastatin (CRESTOR) 10 MG tablet Take 10 mg by mouth daily.      Marland Kitchen HYDROcodone-acetaminophen (NORCO) 5-325 MG per tablet Take 1 tablet by mouth every 6 (six) hours as needed for moderate pain.  15 tablet  0  . ipratropium (ATROVENT) 0.03 % nasal spray Place 2 sprays into the nose every 12 (twelve) hours.  30 mL  1   No current facility-administered medications for this visit.    Review of Systems Review of Systems Constitutional: negative for fatigue and weight loss Respiratory: negative for cough and wheezing Cardiovascular: negative for chest pain, fatigue  and palpitations Gastrointestinal: negative for abdominal pain and change in bowel habits Genitourinary:positive for bleeding after intercourse Integument/breast: negative for nipple discharge Musculoskeletal:negative for myalgias Neurological: negative for gait problems and tremors Behavioral/Psych: negative for abusive relationship, depression Endocrine: negative for temperature intolerance     Blood pressure 146/88, pulse 56, temperature 97.9 F (36.6 C), weight 70.67 kg (155 lb 12.8 oz).  Physical Exam Physical Exam     Data Reviewed U/S Pathology  Assessment    Postcoital bleeding--?secondary to the submucous myoma Normal colposcopy    Plan    Orders Placed This Encounter  Procedures  . POCT urine pregnancy   No orders of the defined types were placed in this encounter.    Possible management options include: hysteroscopic resection Follow up as needed.         JACKSON-MOORE,Winona Sison A 01/27/2014, 10:00 AM

## 2014-01-27 NOTE — Patient Instructions (Signed)
Myomectomy Myomectomy is surgery to remove a noncancerous tumor (myoma) from the uterus. Myomas are tumors made up of fibrous tissue. They are often called fibroid tumors. Fibroid tumors can range from the size of a pea to the size of a grapefruit. In a myomectomy, the fibroid tumor is removed without removing the uterus. Because these tumors are rarely cancerous, this surgery is usually done only if the tumor is growing or causing symptoms such as pain, pressure, bleeding, or pain with intercourse. LET [facility] CARE PROVIDER KNOW ABOUT:  Any allergies you have.  All medicines you are taking, including vitamins, herbs, eye drops, creams, and over-the-counter medicines.  Previous problems you or members of your family have had with the use of anesthetics.  Any blood disorders you have.  Previous surgeries you have had.  Medical conditions you have. RISKS AND COMPLICATIONS  Generally, this is a safe procedure. However, as with any procedure, complications can occur. Possible complications include:  Excessive bleeding.  Infection.  Injury to nearby organs.  Blood clots in the legs, chest, or brain.  Scar tissue on other organs and in the pelvis. This may require another surgery to remove the scar tissue. BEFORE THE PROCEDURE  Ask your health care provider about changing or stopping your regular medicines. Avoid taking aspirin or blood thinners as directed by your health care provider.  Do not  eat or drink anything after midnight on the night before surgery.  If you smoke, do not  smoke for 2 weeks before the surgery.  Do not  drink alcohol the day before the surgery.  Arrange for someone to drive you home after the procedure or after your hospital stay. Also arrange for someone to help you with activities during your recovery. PROCEDURE You will be given medicine to make you sleep through the procedure (general anesthetic). Any of the following methods may be used to perform a  myomectomy:  Small monitors will be put on your body. They are used to check your heart, blood pressure, and oxygen level.  An IV access tube will be put into one of your veins. Medicine will be able to flow directly into your body through this IV tube.  You might be given a medicine to help you relax (sedative).  You will be given a medicine to make you sleep (general anesthetic). A breathing tube will be placed into your lungs during the procedure.  A thin, flexible tube (catheter) will be inserted into your bladder to collect urine.  Any of the following methods may be used to perform a myomectomy:  Hysteroscopic myomectomy--This method may be used when the fibroid tumor is inside the cavity of the uterus. A long, thin tube that is like a telescope (hysteroscope) is inserted inside the uterus. A saline solution is put into your uterus. This expands the uterus and allows the surgeon to see the fibroids. Tools are passed through the hysteroscope to remove the fibroid tumor in pieces.  Laparoscopic myomectomy--A few small cuts (incisions) are made in the lower abdomen. A thin, lighted tube with a tiny camera on the end (laparoscope) is inserted through one of the incisions. This gives the surgeon a good view of the area. The fibroid tumor is removed through the other incisions. The incisions are then closed with stitches (sutures) or staples.  Abdominal myomectomy--This method is used when the fibroid tumor cannot be removed with a hysteroscope or laparoscope. The surgery is performed through a larger surgical incision in the abdomen. The  fibroid tumor is removed through this incision. The incision is closed with sutures or staples. AFTER THE PROCEDURE  If you had a laparoscopic or hysteroscopic myomectomy, you may be able to go home the same day, or you may need to stay in the hospital overnight.  If you had an abdominal myomectomy, you may need to stay in the hospital for a few days.  Your  IV access tube and catheter will be removed in 1-2 days.  You may be given medicine for pain or to help you sleep.  You may be given an antibiotic medicine, if needed. Document Released: 01/21/2007 Document Revised: 01/14/2013 Document Reviewed: 11/05/2012 [facility] Patient Information 2015 [place], [place]. This information is not intended to replace advice given to you by your health care provider. Make sure you discuss any questions you have with your health care provider.

## 2014-02-08 ENCOUNTER — Encounter: Payer: Self-pay | Admitting: Obstetrics & Gynecology

## 2014-04-05 ENCOUNTER — Encounter: Payer: Self-pay | Admitting: *Deleted

## 2014-04-06 ENCOUNTER — Encounter: Payer: Self-pay | Admitting: Obstetrics & Gynecology

## 2014-07-26 ENCOUNTER — Ambulatory Visit: Payer: No Typology Code available for payment source | Admitting: Obstetrics & Gynecology

## 2014-11-01 IMAGING — CR DG CHEST 2V
2 series · 2 of 2 positions shown · non-contrast
Comparison: None.

CLINICAL DATA: Chest pain.

EXAM:
CHEST  2 VIEW

[PA]
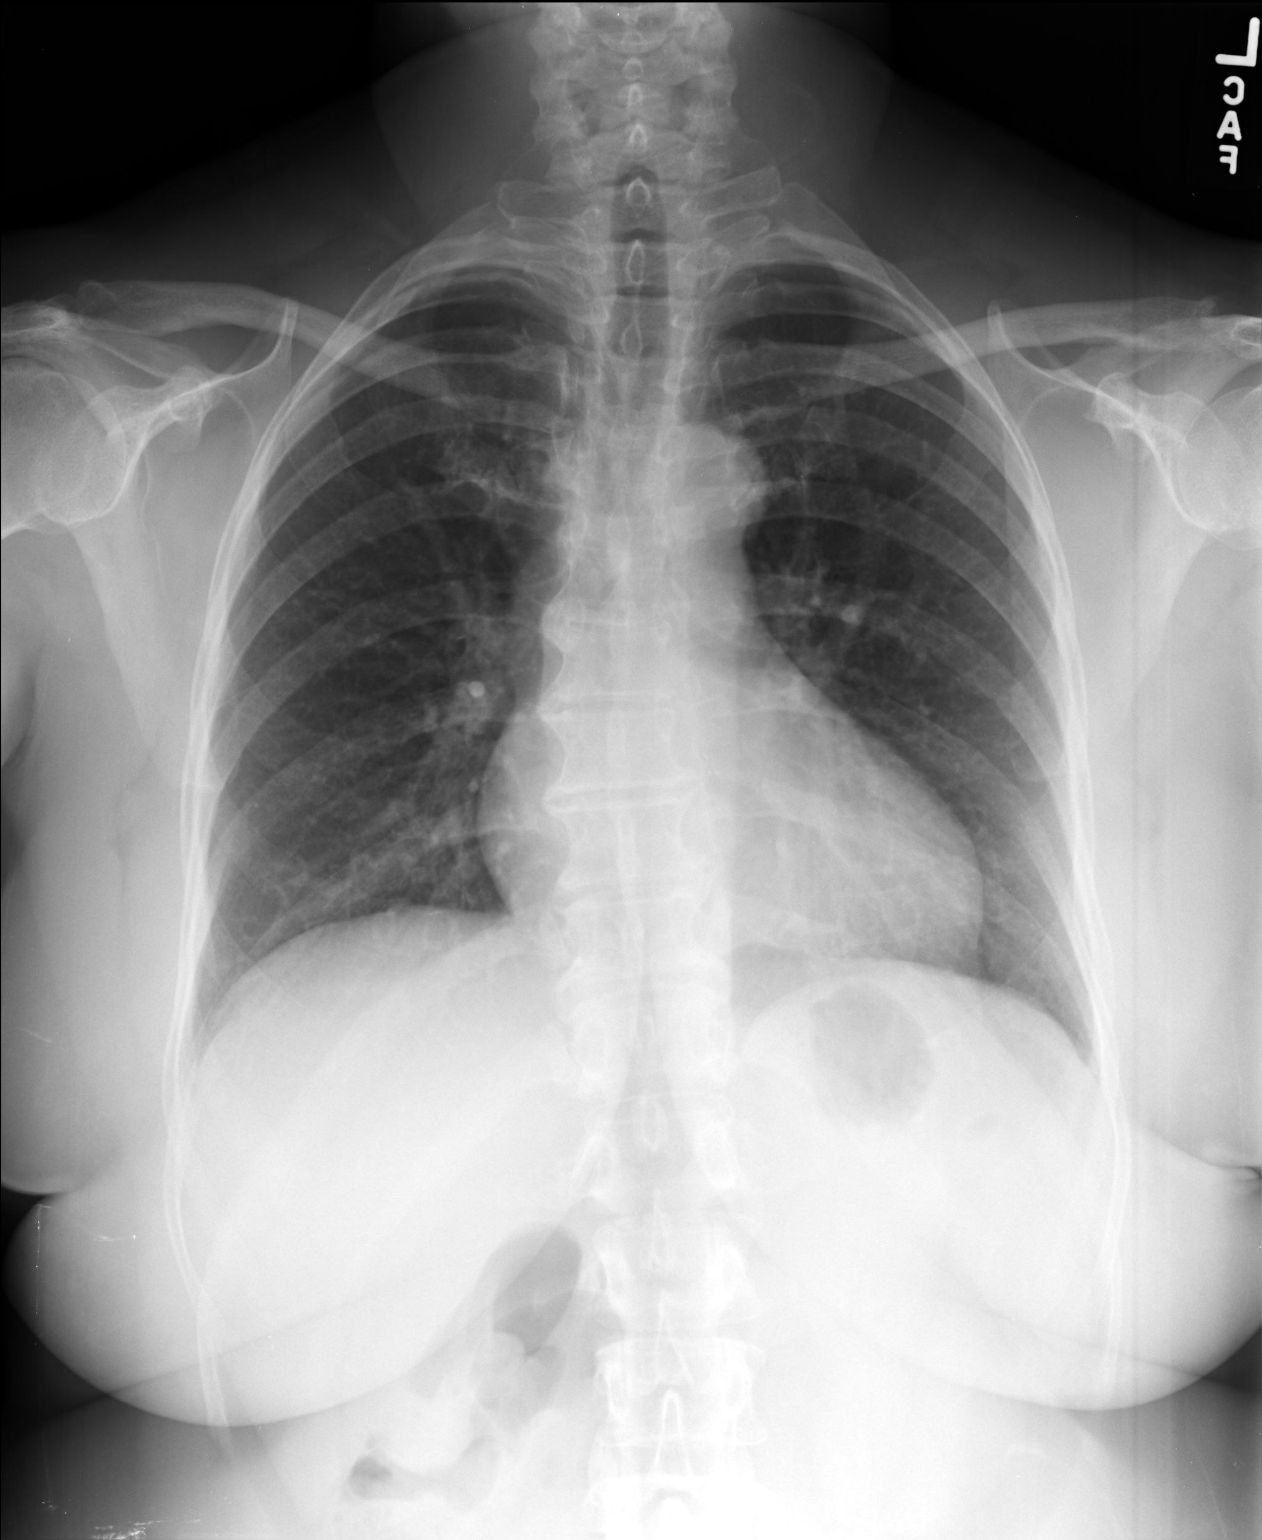

[lateral]
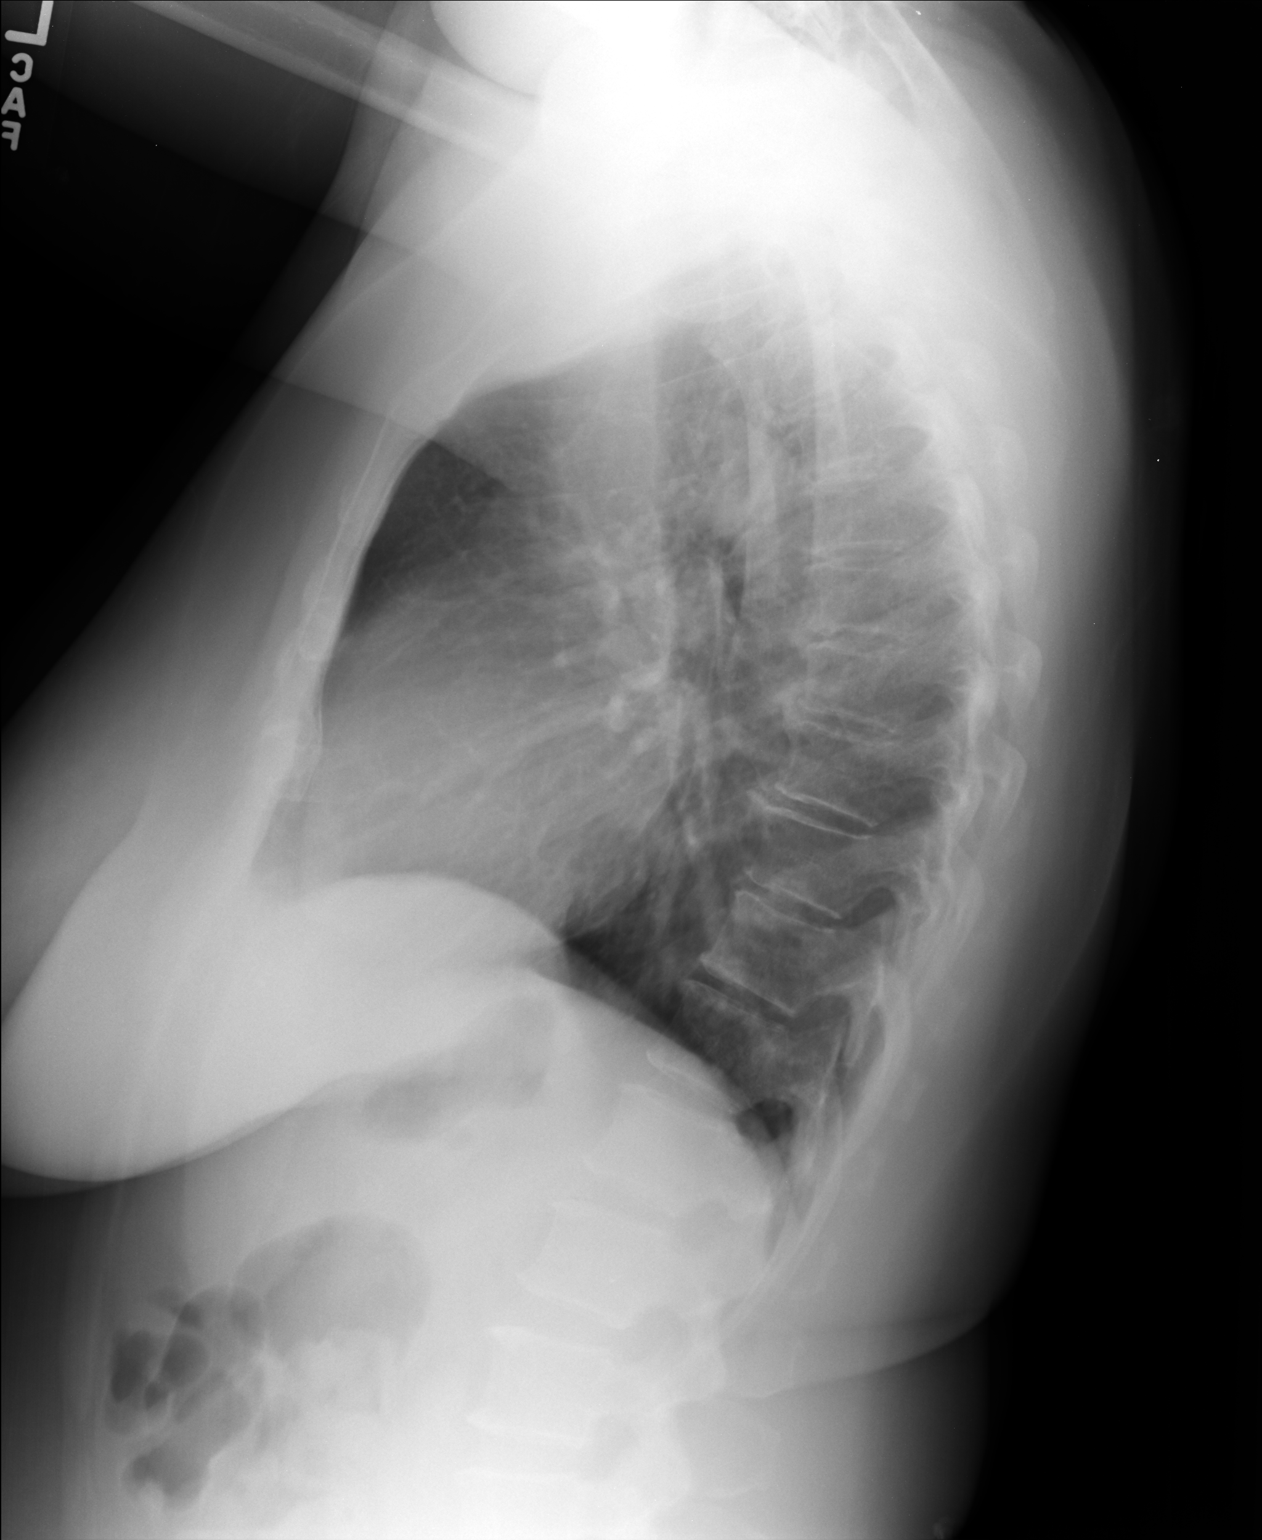

[2 of 2 positions shown; findings below may reference images not displayed]

FINDINGS: The lungs are clear. Heart size is normal. No pneumothorax or
pleural effusion. No focal bony abnormality.
IMPRESSION: No acute disease.

## 2015-02-04 ENCOUNTER — Other Ambulatory Visit: Payer: Self-pay

## 2015-02-04 DIAGNOSIS — Z1231 Encounter for screening mammogram for malignant neoplasm of breast: Secondary | ICD-10-CM

## 2015-09-01 ENCOUNTER — Encounter: Payer: Self-pay | Admitting: Gynecology

## 2015-09-01 ENCOUNTER — Ambulatory Visit (INDEPENDENT_AMBULATORY_CARE_PROVIDER_SITE_OTHER): Payer: BLUE CROSS/BLUE SHIELD | Admitting: Gynecology

## 2015-09-01 VITALS — BP 132/78 | Ht 61.0 in | Wt 152.0 lb

## 2015-09-01 DIAGNOSIS — N941 Unspecified dyspareunia: Secondary | ICD-10-CM

## 2015-09-01 DIAGNOSIS — N950 Postmenopausal bleeding: Secondary | ICD-10-CM | POA: Diagnosis not present

## 2015-09-01 DIAGNOSIS — Z01419 Encounter for gynecological examination (general) (routine) without abnormal findings: Secondary | ICD-10-CM | POA: Diagnosis not present

## 2015-09-01 DIAGNOSIS — A6000 Herpesviral infection of urogenital system, unspecified: Secondary | ICD-10-CM | POA: Diagnosis not present

## 2015-09-01 DIAGNOSIS — D251 Intramural leiomyoma of uterus: Secondary | ICD-10-CM

## 2015-09-01 DIAGNOSIS — N952 Postmenopausal atrophic vaginitis: Secondary | ICD-10-CM

## 2015-09-01 MED ORDER — VALACYCLOVIR HCL 1 G PO TABS: 1000.0000 mg | ORAL_TABLET | Freq: Two times a day (BID) | ORAL | Status: DC

## 2015-09-01 NOTE — Progress Notes (Signed)
Autumn Stuart Oct 19, 1955 [id number]   History:    60 y.o.  for annual gyn exam who is a new patient to the practice was previously being followed by another provider in the community. In 2015 she had complained of dyspareunia and postcoital bleeding and an endometrial biopsy was done by the provider and endometrial biopsy was benign. She also had an ultrasound which demonstrated she had an intramural fibroid which was smaller less than 2 cm in size. She has been separated from her husband for 6 months now patient denied any past history of any abnormal Pap smears. She is not had a colonoscopy as of yet. Her PCP has been treating her for osteoporosis for which she is on alendronate. They have been doing her blood work also. Several years ago patient had removal of an ectopic pregnancy. Patient has never been on any hormone replacement therapy and is suffering for no vasomotor symptoms.  Past medical history,surgical history, family history and social history were all reviewed and documented in the EPIC chart.  Gynecologic History No LMP recorded. Patient is postmenopausal. Contraception: post menopausal status Last Pap: 2012. Results were: normal Last mammogram: 2014. Results were: normal  Obstetric History OB History  Gravida Para Term Preterm AB SAB TAB Ectopic Multiple Living  2 1 1  1   1  1     # Outcome Date GA Lbr Len/2nd Weight Sex Delivery Anes PTL Lv  2 Ectopic           1 Term      Vag-Spont   Y       ROS: A ROS was performed and pertinent positives and negatives are included in the history.  GENERAL: No fevers or chills. HEENT: No change in vision, no earache, sore throat or sinus congestion. NECK: No pain or stiffness. CARDIOVASCULAR: No chest pain or pressure. No palpitations. PULMONARY: No shortness of breath, cough or wheeze. GASTROINTESTINAL: No abdominal pain, nausea, vomiting or diarrhea, melena or bright red blood per rectum. GENITOURINARY: No urinary frequency,  urgency, hesitancy or dysuria. MUSCULOSKELETAL: No joint or muscle pain, no back pain, no recent trauma. DERMATOLOGIC: No rash, no itching, no lesions. ENDOCRINE: No polyuria, polydipsia, no heat or cold intolerance. No recent change in weight. HEMATOLOGICAL: No anemia or easy bruising or bleeding. NEUROLOGIC: No headache, seizures, numbness, tingling or weakness. PSYCHIATRIC: No depression, no loss of interest in normal activity or change in sleep pattern.     Exam: chaperone present  BP 132/78 mmHg  Ht 5\' 1"  (1.549 m)  Wt 152 lb (68.947 kg)  BMI 28.74 kg/m2  Body mass index is 28.74 kg/(m^2).  General appearance : Well developed well nourished female. No acute distress HEENT: Eyes: no retinal hemorrhage or exudates,  Neck supple, trachea midline, no carotid bruits, no thyroidmegaly Lungs: Clear to auscultation, no rhonchi or wheezes, or rib retractions  Heart: Regular rate and rhythm, no murmurs or gallops Breast:Examined in sitting and supine position were symmetrical in appearance, no palpable masses or tenderness,  no skin retraction, no nipple inversion, no nipple discharge, no skin discoloration, no axillary or supraclavicular lymphadenopathy Abdomen: no palpable masses or tenderness, no rebound or guarding Extremities: no edema or skin discoloration or tenderness  Pelvic: Right labia majora small herpetic lesion  Bartholin, Urethra, Skene Glands: Within normal limits             Vagina: No gross lesions or discharge  Cervix: No gross lesions or discharge  Uterus  anteverted, normal  size, shape and consistency, non-tender and mobile  Adnexa  Without masses or tenderness  Anus and perineum  normal   Rectovaginal  normal sphincter tone without palpated masses or tenderness             Hemoccult schedule colonoscopy this year   Because of patient's postmenopausal bleeding she was counseled for an endometrial biopsy. The cervix was cleansed with Betadine solution. The anterior  cervical lip was grasped with single-tooth tenaculum and a sterile Pipelle was introduced into the uterine cavity. Several passes were required an effort to obtain some tissue to submit for histological evaluation. The CO2 tenaculum was removed. Patient tolerated procedure well. Patient was given 2 Aleve after the procedure.  Assessment/Plan:  60 y.o. female for annual exam with postmenopausal bleeding will be scheduled for sonohysterogram in the next couple weeks. 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 Pap smear with high-risk HPV obtained today. Patient was given a requisition schedule mammogram. She was provided with names of gastroenterologist in her community to schedule colonoscopy.   Terrance Mass MD, 4:42 PM 09/01/2015

## 2015-09-01 NOTE — Patient Instructions (Signed)

## 2015-09-06 ENCOUNTER — Telehealth: Payer: Self-pay | Admitting: Gynecology

## 2015-09-06 LAB — PAP, TP IMAGING W/ HPV RNA, RFLX HPV TYPE 16,18/45: HPV mRNA, High Risk: NOT DETECTED

## 2015-09-06 NOTE — Telephone Encounter (Signed)
09/06/15-I LM VM for pt to advise her that [facility] will cover the sonohysterogram under her $20 copay. Per Lara@BC -Ref#[id number]

## 2015-09-07 ENCOUNTER — Other Ambulatory Visit: Payer: Self-pay | Admitting: Gynecology

## 2015-09-07 DIAGNOSIS — N950 Postmenopausal bleeding: Secondary | ICD-10-CM

## 2015-09-14 ENCOUNTER — Encounter: Payer: Self-pay | Admitting: Gynecology

## 2015-09-14 ENCOUNTER — Other Ambulatory Visit: Payer: Self-pay | Admitting: Gynecology

## 2015-09-14 ENCOUNTER — Ambulatory Visit (INDEPENDENT_AMBULATORY_CARE_PROVIDER_SITE_OTHER): Payer: BLUE CROSS/BLUE SHIELD

## 2015-09-14 ENCOUNTER — Ambulatory Visit (INDEPENDENT_AMBULATORY_CARE_PROVIDER_SITE_OTHER): Payer: BLUE CROSS/BLUE SHIELD | Admitting: Gynecology

## 2015-09-14 DIAGNOSIS — N950 Postmenopausal bleeding: Secondary | ICD-10-CM

## 2015-09-14 DIAGNOSIS — D251 Intramural leiomyoma of uterus: Secondary | ICD-10-CM

## 2015-09-14 NOTE — Patient Instructions (Signed)
Laparoscopically Assisted Vaginal Hysterectomy A laparoscopically assisted vaginal hysterectomy (LAVH) is a surgical procedure to remove the uterus and cervix, and sometimes the ovaries and fallopian tubes. During an LAVH, some of the surgical removal is done through the vagina, and the rest is done through a few small surgical cuts (incisions) in the abdomen.  This procedure is usually considered in women when a vaginal hysterectomy is not an option. Your health care provider will discuss the risks and benefits of the different surgical techniques at your appointment. Generally, recovery time is faster and there are fewer complications after laparoscopic procedures than after open incisional procedures. LET YOUR HEALTH CARE PROVIDER KNOW ABOUT:   Any allergies you have.  All medicines you are taking, including vitamins, herbs, eye drops, creams, and over-the-counter medicines.  Previous problems you or members of your family have had with the use of anesthetics.  Any blood disorders you have.  Previous surgeries you have had.  Medical conditions you have. RISKS AND COMPLICATIONS Generally, this is a safe procedure. However, as with any procedure, complications can occur. Possible complications include:  Allergies to medicines.  Difficulty breathing.  Bleeding.  Infection.  Damage to other structures near your uterus and cervix. BEFORE THE PROCEDURE  Ask your health care provider about changing or stopping your regular medicines.  Take certain medicines, such as a colon-emptying preparation, as directed.  Do not eat or drink anything for at least 8 hours before your surgery.  Stop smoking if you smoke. Stopping will improve your health after surgery.  Arrange for a ride home after surgery and for help at home during recovery. PROCEDURE   An IV tube will be put into one of your veins in order to give you fluids and medicines.  You will receive medicines to relax you and  medicines that make you sleep (general anesthetic).  You may have a flexible tube (catheter) put into your bladder to drain urine.  You may have a tube put through your nose or mouth that goes into your stomach (nasogastric tube). The nasogastric tube removes digestive fluids and prevents you from feeling nauseated and from vomiting.  Tight-fitting (compression) stockings will be placed on your legs to promote circulation.  Three to four small incisions will be made in your abdomen. An incision also will be made in your vagina. Probes and tools will be inserted into the small incisions. The uterus and cervix are removed (and possibly your ovaries and fallopian tubes) through your vagina as well as through the small incisions that were made in the abdomen.  Your vagina is then sewn back to normal. AFTER THE PROCEDURE  You may have a liquid diet temporarily. You will most likely return to, and tolerate, your usual diet the day after surgery.  You will be passing urine through a catheter. It will be removed the day after surgery.  Your temperature, breathing rate, heart rate, blood pressure, and oxygen level will be monitored regularly.  You will still wear compression stockings on your legs until you are able to move around.  You will use a special device or do breathing exercises to keep your lungs clear.  You will be encouraged to walk as soon as possible.   This information is not intended to replace advice given to you by your health care provider. Make sure you discuss any questions you have with your health care provider.   Document Released: 03/15/2011 Document Revised: 04/16/2014 Document Reviewed: 10/09/2012 [employer] Interactive Patient Education 2016 [employer]  

## 2015-09-14 NOTE — Progress Notes (Signed)
   Patient is a 60 year old presented to the office today for ongoing evaluation of her postmenopausal bleeding. She was seen the office as a new patient on May 25.In 2015 she had complained of dyspareunia and postcoital bleeding and an endometrial biopsy was done by the provider and endometrial biopsy was benign. She also had an ultrasound which demonstrated she had an intramural fibroid which was smaller less than 2 cm in size. She has been separated from her husband for 6 months now patient denied any past history of any abnormal Pap smears. On that last office visit she had an endometrial biopsy which demonstrated the following:  Diagnosis Endometrium, biopsy - ATROPHIC ENDOMETRIUM WITH BREAKDOWN IN A BACKGROUND OF BLOOD AND MUCOID MATERIAL. - NO HYPERPLASIA, ATYPIA OR MALIGNANCY IDENTIFIED.  Pap smear was normal  Ultrasound/sono history: Uterus measures 7.5 x 3.2 x 3.1 cm with endometrial stripe at 2 mm. An intramural fibroid measuring 21 x 19 mm was noted displacing the endometrial cavity on the right. Right and left ovary normal. No fluid in the cul-de-sac. The cervix was then cleansed with Betadine solution and a sterile catheter was introduced into the uterine cavity and no defect was noted. The anterior fibroid displacing the endometrial cavity was evident.  Assessment/plan: Patient with postmenopausal bleeding attributed to an intramural fibroid encroaching into the endometrial cavity. We discussed several options as follows: #1 maintain a menstrual calendar for the next 6 months in follow-up with an ultrasound #2 off for laparoscopic-assisted vaginal hysterectomy with bilateral salpingo-oophorectomy #3 literature information was provided and we'll schedule accordingly.

## 2015-09-15 ENCOUNTER — Telehealth: Payer: Self-pay

## 2015-09-15 NOTE — Telephone Encounter (Signed)
I left message for patient to call me regarding scheduling. (LAVH,BSO)

## 2015-09-28 ENCOUNTER — Ambulatory Visit
Admission: RE | Admit: 2015-09-28 | Discharge: 2015-09-28 | Disposition: A | Discharge status: Home / Self Care | Payer: BLUE CROSS/BLUE SHIELD | Source: Ambulatory Visit

## 2015-09-28 DIAGNOSIS — Z1231 Encounter for screening mammogram for malignant neoplasm of breast: Secondary | ICD-10-CM

## 2015-09-30 ENCOUNTER — Telehealth: Payer: Self-pay

## 2015-09-30 NOTE — Telephone Encounter (Signed)
Patient returned my call regarding scheduling LAVH,BSO.  Patient wanted me to ask Dr. Moshe Salisbury is anyway to remove the fibroid without doing a hysterectomy?

## 2015-09-30 NOTE — Telephone Encounter (Signed)
I do not understand your answer well enough to explain to patient.    So, there is a procedure to remove just the fibroid? Myomectomy?  But better to do hysterectomy because the other procedure the benefits of it do not outweigh risk?

## 2015-09-30 NOTE — Telephone Encounter (Signed)
At the age of 28 the benefits did not outweigh the risk

## 2015-10-03 NOTE — Telephone Encounter (Signed)
Dr. Toney Rakes came back and spoke with me and explained his answer. I explained to the patient that myomectomy is the only other way to remove the fibroid. Increased bleeding with that is a risk. At her age when child bearing no longer an issue that the LAVH is a much safer option than the myomectomy. She understands. For the time being she would like to opt to observe and recheck with u/s in 6 mos per discussion with Dr. Moshe Salisbury.  Recall placed.

## 2016-01-23 ENCOUNTER — Other Ambulatory Visit: Payer: Self-pay | Admitting: *Deleted

## 2016-01-23 DIAGNOSIS — D251 Intramural leiomyoma of uterus: Secondary | ICD-10-CM

## 2016-02-20 ENCOUNTER — Telehealth: Payer: Self-pay | Admitting: *Deleted

## 2016-02-20 MED ORDER — MEGESTROL ACETATE 40 MG PO TABS: ORAL_TABLET | ORAL | 2 refills | Status: DC

## 2016-02-20 NOTE — Telephone Encounter (Signed)
Will send message to claudia to see this could be done.

## 2016-02-20 NOTE — Telephone Encounter (Signed)
Yes please

## 2016-02-20 NOTE — Telephone Encounter (Signed)
Dr.Fernandez patient is scheduled for transvaginal ultrasound on 03/22/16, not sonohysterogram. I will send Rx in for megace, were you still wanting to see if ultrasound could be moved to the end of this month? Please advise

## 2016-02-20 NOTE — Telephone Encounter (Signed)
Pt called today c/o light bleeding x 1 week now, endometrial biopsy was benign with [facility] in May, has fibroid, scheduled for follow up ultrasound on 03/22/16. Pt would like recommendations regarding bleeding. Please advise

## 2016-02-20 NOTE — Telephone Encounter (Signed)
Call in prescription for Megace 40 mg twice a day for 10 days with 2 refills until her sonohysterogram we move her appointment to sometime at the end of this month

## 2016-02-22 ENCOUNTER — Telehealth: Payer: Self-pay

## 2016-02-22 NOTE — Telephone Encounter (Signed)
Patient called because she read the insert on her recent Rx for Megace 40 mg. She is concerned because it mentions being used for cancer patients. I assured her this is a standard method of treatment that our GYN MD's use to stop prolonged bleeding and she will be on low dose for two weeks and should not notice side affects.  She said her bleeding has stopped and asked was it necessary. I told her if bleeding stopped she did not have to take it.  She noted while we were talking blood on her pad again. She said she is going to go ahead and give it a try.

## 2016-03-07 ENCOUNTER — Ambulatory Visit (INDEPENDENT_AMBULATORY_CARE_PROVIDER_SITE_OTHER): Payer: BLUE CROSS/BLUE SHIELD

## 2016-03-07 ENCOUNTER — Ambulatory Visit (INDEPENDENT_AMBULATORY_CARE_PROVIDER_SITE_OTHER): Payer: BLUE CROSS/BLUE SHIELD | Admitting: Gynecology

## 2016-03-07 ENCOUNTER — Other Ambulatory Visit: Payer: Self-pay | Admitting: Gynecology

## 2016-03-07 ENCOUNTER — Encounter: Payer: Self-pay | Admitting: Gynecology

## 2016-03-07 DIAGNOSIS — D251 Intramural leiomyoma of uterus: Secondary | ICD-10-CM | POA: Diagnosis not present

## 2016-03-07 DIAGNOSIS — N950 Postmenopausal bleeding: Secondary | ICD-10-CM | POA: Diagnosis not present

## 2016-03-07 DIAGNOSIS — N930 Postcoital and contact bleeding: Secondary | ICD-10-CM

## 2016-03-07 DIAGNOSIS — N841 Polyp of cervix uteri: Secondary | ICD-10-CM

## 2016-03-07 NOTE — Progress Notes (Signed)
   Patient is a 60 year old that presented to the office for evaluation of her postcoital bleeding. On further questioning she states that time she has noted some spotting even when she is not having intercourse. She'll call the office several weeks ago with this symptom and by the time we got her into the office with prescribed her Megace 40 mg twice a day for 10 days with health very little. Review of her record indicated she was seen the office in June 2017. And her record and demonstrated she had postcoital bleeding at that time and had a normal endometrial biopsy by another provider. She was found to have 2 small intramural fibroids.  On June 2017 we did an ultrasound/sono hysterogram in the following was noted: Uterus measures 7.5 x 3.2 x 3.1 cm with endometrial stripe at 2 mm. An intramural fibroid measuring 21 x 19 mm was noted displacing the endometrial cavity on the right. Right and left ovary normal. No fluid in the cul-de-sac. The cervix was then cleansed with Betadine solution and a sterile catheter was introduced into the uterine cavity and no defect was noted. The anterior fibroid displacing the endometrial cavity was evident.  She was instructed to keep a calendar and report any unusual bleeding and this is the reason for the visit today.  Ultrasound sonohysterogram done today. Upon placement of the speculum it was noted that she had a friable cervical polyp protruding from the external os small. Betadine solution was applied to the cervix. A sterile catheter was introduced into the uterine cavity and fluid was introduced no intracavitary defect was noted with the exception of an intramural fibroid that was impinging slightly on the endometrium no true submucous myoma no intrauterine defect. She had 1 small intramural myoma measuring 23 x 23 mm. Right and left ovary normal. Endometrial stripe 5 mm. I sterile Pipelle was then introduced into the uterine cavity minimal tissue obtained submitted  for histological evaluation.  Betadine solution was applied to the cervix once again with the use of a [place] clamp the cervical polyp was twisted office pedicle and was submitted for histological evaluation. Silver nitrate was applied to the area for hemostasis. We'll wait for the 2 operative reports. If all benign we'll monitor the next 2 months if she continues to have bleeding and may be attributed to the encroaching fibroid and then we can discuss possibility of intervening with definitive treatment such as a hysterectomy.

## 2016-03-16 ENCOUNTER — Encounter: Payer: Self-pay | Admitting: Gynecology

## 2016-03-16 ENCOUNTER — Ambulatory Visit (INDEPENDENT_AMBULATORY_CARE_PROVIDER_SITE_OTHER): Payer: BLUE CROSS/BLUE SHIELD | Admitting: Gynecology

## 2016-03-16 VITALS — BP 132/70

## 2016-03-16 DIAGNOSIS — N898 Other specified noninflammatory disorders of vagina: Secondary | ICD-10-CM | POA: Diagnosis not present

## 2016-03-16 DIAGNOSIS — N760 Acute vaginitis: Secondary | ICD-10-CM | POA: Diagnosis not present

## 2016-03-16 DIAGNOSIS — B9689 Other specified bacterial agents as the cause of diseases classified elsewhere: Secondary | ICD-10-CM | POA: Diagnosis not present

## 2016-03-16 LAB — WET PREP FOR TRICH, YEAST, CLUE
TRICH WET PREP: NONE SEEN
Yeast Wet Prep HPF POC: NONE SEEN

## 2016-03-16 MED ORDER — CLINDAMYCIN PHOSPHATE 2 % VA CREA: 1.0000 | TOPICAL_CREAM | Freq: Every day | VAGINAL | 1 refills | Status: DC

## 2016-03-16 NOTE — Progress Notes (Signed)
   Patient presented to the office today with a complaint of a yellowish brownish discharge with some odor for the past few days. Review of her record indicates she was seen the office on November 29 this part of her workup for postcoital bleeding as follows:  "On the last office visit she had complained of postcoital bleeding.On further questioning she states that time she has noted some spotting even when she is not having intercourse. She'll call the office several weeks ago with this symptom and by the time we got her into the office with prescribed her Megace 40 mg twice a day for 10 days with health very little. Review of her record indicated she was seen the office in June 2017. And her record and demonstrated she had postcoital bleeding at that time and had a normal endometrial biopsy by another provider. She was found to have 2 small intramural fibroids.  On June 2017 we did an ultrasound/sono hysterogram in the following was noted: Uterus measures 7.5 x 3.2 x 3.1 cm with endometrial stripe at 2 mm. An intramural fibroid measuring 21 x 19 mm was noted displacing the endometrial cavity on the right. Right and left ovary normal. No fluid in the cul-de-sac. The cervix was then cleansed with Betadine solution and a sterile catheter was introduced into the uterine cavity and no defect was noted. The anterior fibroid displacing the endometrial cavity was evident.  Ultrasound/sono hysterogram:no intracavitary defect was noted with the exception of an intramural fibroid that was impinging slightly on the endometrium no true submucous myoma no intrauterine defect. She had 1 small intramural myoma measuring 23 x 23 mm. Right and left ovary normal. Endometrial stripe 5 mm.  Pathology report: Diagnosis Endometrium, biopsy, uterus - SCANTY ATROPHIC LOWER SEGMENT ENDOMETRIUM AND BENIGN ENDOCERVIX. - NO ATYPIA OR MALIGNANCY. Diagnosis Cervix, polyp - ENDOCERVICAL POLYP WITH MARKED CHRONIC ACTIVE  INFLAMMATION AND EROSION. - NO ATYPIA, DYSPLASIA OR MALIGNANCY IDENTIFIED.  Exam: Abdomen: Soft nontender no rebound or guarding Pelvic: Bartholin urethra Skene was within normal limits Vagina: No gross lesions on inspection Cervix: No gross lesions on inspection Bimanual exam not done recently had ultrasound see above. Rectal exam: Not done  Wet prep few clue cells, moderate white blood cell, many bacteria  Assessment/plan: Bacterial vaginosis will be treated with Cleocin vaginal cream to apply daily at bedtime for 7-10 days.

## 2016-03-16 NOTE — Patient Instructions (Signed)
Bacterial Vaginosis Bacterial vaginosis is a vaginal infection that occurs when the normal balance of bacteria in the vagina is disrupted. It results from an overgrowth of certain bacteria. This is the most common vaginal infection among women ages 15-44. Because bacterial vaginosis increases your risk for STIs (sexually transmitted infections), getting treated can help reduce your risk for chlamydia, gonorrhea, herpes, and HIV (human immunodeficiency virus). Treatment is also important for preventing complications in pregnant women, because this condition can cause an early (premature) delivery. What are the causes? This condition is caused by an increase in harmful bacteria that are normally present in small amounts in the vagina. However, the reason that the condition develops is not fully understood. What increases the risk? The following factors may make you more likely to develop this condition:  Having a new sexual partner or multiple sexual partners.  Having unprotected sex.  Douching.  Having an intrauterine device (IUD).  Smoking.  Drug and alcohol abuse.  Taking certain antibiotic medicines.  Being pregnant.  You cannot get bacterial vaginosis from toilet seats, bedding, swimming pools, or contact with objects around you. What are the signs or symptoms? Symptoms of this condition include:  Grey or white vaginal discharge. The discharge can also be watery or foamy.  A fish-like odor with discharge, especially after sexual intercourse or during menstruation.  Itching in and around the vagina.  Burning or pain with urination.  Some women with bacterial vaginosis have no signs or symptoms. How is this diagnosed? This condition is diagnosed based on:  Your medical history.  A physical exam of the vagina.  Testing a sample of vaginal fluid under a microscope to look for a large amount of bad bacteria or abnormal cells. Your health care provider may use a cotton swab  or a small wooden spatula to collect the sample.  How is this treated? This condition is treated with antibiotics. These may be given as a pill, a vaginal cream, or a medicine that is put into the vagina (suppository). If the condition comes back after treatment, a second round of antibiotics may be needed. Follow these instructions at home: Medicines  Take over-the-counter and prescription medicines only as told by your health care provider.  Take or use your antibiotic as told by your health care provider. Do not stop taking or using the antibiotic even if you start to feel better. General instructions  If you have a female sexual partner, tell her that you have a vaginal infection. She should see her health care provider and be treated if she has symptoms. If you have a female sexual partner, he does not need treatment.  During treatment: ? Avoid sexual activity until you finish treatment. ? Do not douche. ? Avoid alcohol as directed by your health care provider. ? Avoid breastfeeding as directed by your health care provider.  Drink enough water and fluids to keep your urine clear or pale yellow.  Keep the area around your vagina and rectum clean. ? Wash the area daily with warm water. ? Wipe yourself from front to back after using the toilet.  Keep all follow-up visits as told by your health care provider. This is important. How is this prevented?  Do not douche.  Wash the outside of your vagina with warm water only.  Use protection when having sex. This includes latex condoms and dental dams.  Limit how many sexual partners you have. To help prevent bacterial vaginosis, it is best to have sex with just   one partner (monogamous).  Make sure you and your sexual partner are tested for STIs.  Wear cotton or cotton-lined underwear.  Avoid wearing tight pants and pantyhose, especially during summer.  Limit the amount of alcohol that you drink.  Do not use any products that  contain nicotine or tobacco, such as cigarettes and e-cigarettes. If you need help quitting, ask your health care provider.  Do not use illegal drugs. Where to find more information:  Centers for Disease Control and Prevention: www.cdc.gov/std  American Sexual Health Association (ASHA): www.ashastd.org  U.S. Department of Health and Human Services, Office on Women's Health: www.womenshealth.gov/ or https://www.womenshealth.gov/a-z-topics/bacterial-vaginosis Contact a health care provider if:  Your symptoms do not improve, even after treatment.  You have more discharge or pain when urinating.  You have a fever.  You have pain in your abdomen.  You have pain during sex.  You have vaginal bleeding between periods. Summary  Bacterial vaginosis is a vaginal infection that occurs when the normal balance of bacteria in the vagina is disrupted.  Because bacterial vaginosis increases your risk for STIs (sexually transmitted infections), getting treated can help reduce your risk for chlamydia, gonorrhea, herpes, and HIV (human immunodeficiency virus). Treatment is also important for preventing complications in pregnant women, because the condition can cause an early (premature) delivery.  This condition is treated with antibiotic medicines. These may be given as a pill, a vaginal cream, or a medicine that is put into the vagina (suppository). This information is not intended to replace advice given to you by your health care provider. Make sure you discuss any questions you have with your health care provider. Document Released: 03/26/2005 Document Revised: 12/10/2015 Document Reviewed: 12/10/2015 Elsevier Interactive Patient Education  2017 [employer] Inc.  

## 2016-03-22 ENCOUNTER — Other Ambulatory Visit: Payer: BLUE CROSS/BLUE SHIELD

## 2016-03-22 ENCOUNTER — Ambulatory Visit: Payer: BLUE CROSS/BLUE SHIELD | Admitting: Gynecology

## 2016-07-10 ENCOUNTER — Emergency Department (HOSPITAL_COMMUNITY): Payer: BLUE CROSS/BLUE SHIELD

## 2016-07-10 ENCOUNTER — Emergency Department (HOSPITAL_COMMUNITY)
Admission: EM | Admit: 2016-07-10 | Discharge: 2016-07-10 | Disposition: A | Discharge status: Home / Self Care | Payer: BLUE CROSS/BLUE SHIELD | Attending: Emergency Medicine | Admitting: Emergency Medicine

## 2016-07-10 ENCOUNTER — Encounter (HOSPITAL_COMMUNITY): Payer: Self-pay | Admitting: Emergency Medicine

## 2016-07-10 DIAGNOSIS — Z79899 Other long term (current) drug therapy: Secondary | ICD-10-CM | POA: Diagnosis not present

## 2016-07-10 DIAGNOSIS — B9789 Other viral agents as the cause of diseases classified elsewhere: Secondary | ICD-10-CM

## 2016-07-10 DIAGNOSIS — J069 Acute upper respiratory infection, unspecified: Secondary | ICD-10-CM | POA: Diagnosis not present

## 2016-07-10 DIAGNOSIS — Z87891 Personal history of nicotine dependence: Secondary | ICD-10-CM | POA: Insufficient documentation

## 2016-07-10 DIAGNOSIS — I10 Essential (primary) hypertension: Secondary | ICD-10-CM | POA: Diagnosis not present

## 2016-07-10 DIAGNOSIS — R509 Fever, unspecified: Secondary | ICD-10-CM | POA: Diagnosis present

## 2016-07-10 LAB — INFLUENZA PANEL BY PCR (TYPE A & B)
INFLBPCR: NEGATIVE
Influenza A By PCR: NEGATIVE

## 2016-07-10 MED ORDER — BENZONATATE 100 MG PO CAPS: 100.0000 mg | ORAL_CAPSULE | Freq: Three times a day (TID) | ORAL | 0 refills | Status: DC

## 2016-07-10 MED ORDER — ACETAMINOPHEN 325 MG PO TABS
650.0000 mg | ORAL_TABLET | Freq: Once | ORAL | Status: AC
  Administered 2016-07-10: 650 mg via ORAL
  Filled 2016-07-10: qty 2

## 2016-07-10 MED ORDER — IBUPROFEN 600 MG PO TABS: 600.0000 mg | ORAL_TABLET | Freq: Four times a day (QID) | ORAL | 0 refills | Status: AC | PRN

## 2016-07-10 NOTE — ED Provider Notes (Signed)
[facility] [facility] DEPT Provider Note   CSN: [id number] Arrival date & time: 07/10/16  0804     History   Chief Complaint Chief Complaint  Patient presents with  . Cough  . Fever    HPI Autumn Stuart is a 61 y.o. female.  HPI Patient presents to the emergency room with complaints of cough and fever.  Patient states she started feeling poorly over the weekend. She had upper respiratory symptoms including cough and congestion.  Her symptoms continued and she was able to go to work. This morning while at work she started to feel very chilled. She started shaking and couldn't stop. 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 Her temperature was taken and she had a fever. She was sent home from work and was instructed to see a doctor.  Patient states she does not have any other symptoms other than coughing. She denies any earache or sore throat. Her mouth does feel dry. She denies any abdominal pain. No vomiting or diarrhea. No dysuria. No recent trips or travel. No recent antibiotics. Past Medical History:  Diagnosis Date  . Arthritis   . Carpal tunnel syndrome   . High cholesterol   . Hypertension   . Yeast infection     Patient Active Problem List   Diagnosis Date Noted  . PMB (postmenopausal bleeding) 09/01/2015  . Intramural leiomyoma of uterus 09/01/2015  . Essential hypertension, benign 12/03/2013  . Hyperlipidemia 12/03/2013  . Osteoporosis, unspecified 12/03/2013    Past Surgical History:  Procedure Laterality Date  . ECTOPIC PREGNANCY SURGERY      OB History    Gravida Para Term Preterm AB Living   2 1 1   1 1    SAB TAB Ectopic Multiple Live Births       1   1       Home Medications    Prior to Admission medications   Medication Sig Start Date End Date Taking? Authorizing Provider  alendronate (FOSAMAX) 70 MG tablet Take 70 mg by mouth every Monday. Take with a full glass of water on an empty stomach.    Yes Historical Provider, MD  diclofenac sodium  (VOLTAREN) 1 % GEL Apply 2 g topically 4 (four) times daily as needed (for pain).    Yes Historical Provider, MD  DM-Doxylamine-Acetaminophen (VICKS NYQUIL COLD & FLU NIGHT) 15-6.25-325 MG CAPS Take 2 capsules by mouth once.   Yes Historical Provider, MD  Multiple Vitamin (MULTIVITAMIN WITH MINERALS) TABS tablet Take 1 tablet by mouth daily.   Yes Historical Provider, MD  rosuvastatin (CRESTOR) 10 MG tablet Take 10 mg by mouth daily.   Yes Historical Provider, MD  benzonatate (TESSALON) 100 MG capsule Take 1 capsule (100 mg total) by mouth every 8 (eight) hours. 07/10/16   Dorie Rank, MD  clindamycin (CLEOCIN) 2 % vaginal cream Place 1 Applicatorful vaginally at bedtime. Patient not taking: Reported on 07/10/2016 03/16/16   Terrance Mass, MD  ibuprofen (ADVIL,MOTRIN) 600 MG tablet Take 1 tablet (600 mg total) by mouth every 6 (six) hours as needed. 07/10/16   Dorie Rank, MD  ipratropium (ATROVENT) 0.03 % nasal spray Place 2 sprays into the nose every 12 (twelve) hours. Patient not taking: Reported on 07/10/2016 02/12/13   Gregor Hams, MD  megestrol (MEGACE) 40 MG tablet Take one tablet twice a day for 10 days Patient not taking: Reported on 07/10/2016 02/20/16   Terrance Mass, MD    Family History Family History  Problem  Relation Age of Onset  . Heart disease Mother   . Hypertension Mother   . High Cholesterol Mother   . Thyroid disease Mother   . Heart disease Father   . Diabetes Sister   . High Cholesterol Sister   . Hypertension Sister   . Thyroid disease Sister   . Cancer Brother   . Hypertension Brother   . High Cholesterol Brother     Social History Social History  Substance Use Topics  . Smoking status: Former Research scientist (life sciences)  . Smokeless tobacco: Former Systems developer    Quit date: 04/09/1993  . Alcohol use No     Allergies   Patient has no known allergies.   Review of Systems Review of Systems  All other systems reviewed and are negative.    Physical Exam Updated Vital Signs BP  122/62 (BP Location: Right Arm)   Pulse 85   Temp 98.7 F (37.1 C) (Oral)   Resp 16   Ht 5' (1.524 m)   SpO2 95%   Physical Exam  Constitutional: She appears well-developed and well-nourished. No distress.  HENT:  Head: Normocephalic and atraumatic.  Right Ear: Tympanic membrane and external ear normal.  Left Ear: Tympanic membrane and external ear normal.  Mouth/Throat: Oropharynx is clear and moist. No oropharyngeal exudate or posterior oropharyngeal edema.  Eyes: Conjunctivae are normal. Right eye exhibits no discharge. Left eye exhibits no discharge. No scleral icterus.  Neck: Neck supple. No tracheal deviation present.  Cardiovascular: Normal rate, regular rhythm and intact distal pulses.   Pulmonary/Chest: Effort normal and breath sounds normal. No stridor. No respiratory distress. She has no wheezes. She has no rales.  Abdominal: Soft. Bowel sounds are normal. She exhibits no distension. There is no tenderness. There is no rebound and no guarding.  Musculoskeletal: She exhibits no edema or tenderness.  Neurological: She is alert. She has normal strength. No cranial nerve deficit (no facial droop, extraocular movements intact, no slurred speech) or sensory deficit. She exhibits normal muscle tone. She displays no seizure activity. Coordination normal.  Skin: Skin is warm and dry. No rash noted.  Psychiatric: She has a normal mood and affect.  Nursing note and vitals reviewed.    ED Treatments / Results    Radiology Dg Chest 2 View  Result Date: 07/10/2016 CLINICAL DATA:  Chest congestion.  Cough.  Shortness of breath. EXAM: CHEST  2 VIEW COMPARISON:  12/03/2013. FINDINGS: Mediastinum and hilar structures normal. Lungs are clear. No pleural effusion or pneumothorax. Heart size normal. Degenerative changes thoracic spine. Mild thoracic spine scoliosis. Degenerative changes both shoulders. IMPRESSION: No acute cardiopulmonary disease. Electronically Signed   By: Marcello Moores  Register    On: 07/10/2016 08:56    Procedures Procedures (including critical care time)  Medications Ordered in ED Medications  acetaminophen (TYLENOL) tablet 650 mg (650 mg Oral Given 07/10/16 0840)     Initial Impression / Assessment and Plan / ED Course  I have reviewed the triage vital signs and the nursing notes.  Pertinent labs & imaging results that were available during my care of the patient were reviewed by me and considered in my medical decision making (see chart for details).  Patient presents to the emergency room with respiratory symptoms and a fever. She has temperature here after 103.  She denies any abdominal pain or dysuria or any other symptoms. He does not show pneumonia. Influenza test is negative. Patient does work in a nursing facility. I suspect her symptoms are related to a viral  upper respiratory infection. We discussed supportive care. Follow-up with her doctor later this week if she continues to have fevers.  Final Clinical Impressions(s) / ED Diagnoses   Final diagnoses:  Viral URI with cough    New Prescriptions New Prescriptions   BENZONATATE (TESSALON) 100 MG CAPSULE    Take 1 capsule (100 mg total) by mouth every 8 (eight) hours.   IBUPROFEN (ADVIL,MOTRIN) 600 MG TABLET    Take 1 tablet (600 mg total) by mouth every 6 (six) hours as needed.     Dorie Rank, MD 07/10/16 1130

## 2016-07-10 NOTE — Discharge Instructions (Signed)
Drink plenty fluids, rest, take ibuprofen or Tylenol as needed for fever, follow-up with your primary doctor later this week as we discussed if you're not feeling better

## 2016-07-10 NOTE — ED Triage Notes (Signed)
Pt reports cough and generalized body aches since Sunday. Began to have cold chills this am. No fever earlier today, but now has fever of 103F.

## 2016-08-22 ENCOUNTER — Encounter: Payer: Self-pay | Admitting: Gynecology

## 2016-09-14 ENCOUNTER — Ambulatory Visit (INDEPENDENT_AMBULATORY_CARE_PROVIDER_SITE_OTHER): Payer: BLUE CROSS/BLUE SHIELD | Admitting: Gynecology

## 2016-09-14 ENCOUNTER — Encounter: Payer: Self-pay | Admitting: Gynecology

## 2016-09-14 ENCOUNTER — Other Ambulatory Visit: Payer: Self-pay | Admitting: Gynecology

## 2016-09-14 VITALS — BP 130/80 | Ht 61.0 in | Wt 147.0 lb

## 2016-09-14 DIAGNOSIS — N952 Postmenopausal atrophic vaginitis: Secondary | ICD-10-CM | POA: Diagnosis not present

## 2016-09-14 DIAGNOSIS — N760 Acute vaginitis: Secondary | ICD-10-CM

## 2016-09-14 DIAGNOSIS — Z79890 Hormone replacement therapy (postmenopausal): Secondary | ICD-10-CM

## 2016-09-14 DIAGNOSIS — Z1231 Encounter for screening mammogram for malignant neoplasm of breast: Secondary | ICD-10-CM

## 2016-09-14 DIAGNOSIS — N898 Other specified noninflammatory disorders of vagina: Secondary | ICD-10-CM

## 2016-09-14 DIAGNOSIS — B9689 Other specified bacterial agents as the cause of diseases classified elsewhere: Secondary | ICD-10-CM

## 2016-09-14 DIAGNOSIS — Z01419 Encounter for gynecological examination (general) (routine) without abnormal findings: Secondary | ICD-10-CM | POA: Diagnosis not present

## 2016-09-14 LAB — WET PREP FOR TRICH, YEAST, CLUE
TRICH WET PREP: NONE SEEN
WBC, Wet Prep HPF POC: NONE SEEN
YEAST WET PREP: NONE SEEN

## 2016-09-14 MED ORDER — ESTRADIOL 10 MCG VA TABS: 1.0000 | ORAL_TABLET | VAGINAL | 11 refills | Status: DC

## 2016-09-14 NOTE — Patient Instructions (Addendum)
Estradiol vaginal tablets What is this medicine? ESTRADIOL (es tra DYE ole) vaginal tablet is used to help relieve symptoms of vaginal irritation and dryness that occurs in some women during menopause. This medicine may be used for other purposes; ask your health care provider or pharmacist if you have questions. COMMON BRAND NAME(S): Vagifem, Yuvafem What should I tell my health care provider before I take this medicine? They need to know if you have any of these conditions: -abnormal vaginal bleeding -blood vessel disease or blood clots -breast, cervical, endometrial, ovarian, liver, or uterine cancer -dementia -diabetes -gallbladder disease -heart disease or recent heart attack -high blood pressure -high cholesterol -high level of calcium in the blood -hysterectomy -kidney disease -liver disease -migraine headaches -protein C deficiency -protein S deficiency -stroke -systemic lupus erythematosus (SLE) -tobacco smoker -an unusual or allergic reaction to estrogens, other hormones, medicines, foods, dyes, or preservatives -pregnant or trying to get pregnant -breast-feeding How should I use this medicine? This medicine is only for use in the vagina. Do not take by mouth. Wash and dry your hands before and after use. Read package directions carefully. Unwrap the applicator package. Be sure to use a new applicator for each dose. Use at the same time each day. If the tablet has fallen out of the applicator, but is still in the package, carefully place it back into the applicator. If the tablet has fallen out of the package, that applicator should be thrown out and you should use a new applicator containing a new tablet. Lie on your back, part and bend your knees. Gently insert the applicator as far as comfortably possible into the vagina. Then, gently press the plunger until the plunger is fully depressed. This will release the tablet into the vagina. Gently remove the applicator. Throw  away the applicator after use. Do not use your medicine more often than directed. Do not stop using except on the advice of your doctor or health care professional. Talk to your pediatrician regarding the use of this medicine in children. This medicine is not approved for use in children. A patient package insert for the product will be given with each prescription and refill. Read this sheet carefully each time. The sheet may change frequently. Overdosage: If you think you have taken too much of this medicine contact a poison control center or emergency room at once. NOTE: This medicine is only for you. Do not share this medicine with others. What if I miss a dose? If you miss a dose, take it as soon as you can. If it is almost time for your next dose, take only that dose. Do not take double or extra doses. What may interact with this medicine? Do not take this medicine with any of the following medications: -aromatase inhibitors like aminoglutethimide, anastrozole, exemestane, letrozole, testolactone This medicine may also interact with the following medications: -antibiotics used to treat tuberculosis like rifabutin, rifampin and rifapentene -raloxifene or tamoxifen -warfarin This list may not describe all possible interactions. Give your health care provider a list of all the medicines, herbs, non-prescription drugs, or dietary supplements you use. Also tell them if you smoke, drink alcohol, or use illegal drugs. Some items may interact with your medicine. What should I watch for while using this medicine? Visit your health care professional for regular checks on your progress. You will need a regular breast and pelvic exam. You should also discuss the need for regular mammograms with your health care professional, and follow his or her  guidelines. This medicine can make your body retain fluid, making your fingers, hands, or ankles swell. Your blood pressure can go up. Contact your doctor or  health care professional if you feel you are retaining fluid. If you have any reason to think you are pregnant; stop taking this medicine at once and contact your doctor or health care professional. Tobacco smoking increases the risk of getting a blood clot or having a stroke, especially if you are more than 60 years old. You are strongly advised not to smoke. If you wear contact lenses and notice visual changes, or if the lenses begin to feel uncomfortable, consult your eye care specialist. If you are going to have elective surgery, you may need to stop taking this medicine beforehand. Consult your health care professional for advice prior to scheduling the surgery. What side effects may I notice from receiving this medicine? Side effects that you should report to your doctor or health care professional as soon as possible: -allergic reactions like skin rash, itching or hives, swelling of the face, lips, or tongue -breast tissue changes or discharge -changes in vision -chest pain -confusion, trouble speaking or understanding -dark urine -general ill feeling or flu-like symptoms -light-colored stools -nausea, vomiting -pain, swelling, warmth in the leg -right upper belly pain -severe headaches -shortness of breath -sudden numbness or weakness of the face, arm or leg -trouble walking, dizziness, loss of balance or coordination -unusual vaginal bleeding -yellowing of the eyes or skin Side effects that usually do not require medical attention (report to your doctor or health care professional if they continue or are bothersome): -hair loss -increased hunger or thirst -increased urination -symptoms of vaginal infection like itching, irritation or unusual discharge -unusually weak or tired This list may not describe all possible side effects. Call your doctor for medical advice about side effects. You may report side effects to FDA at 1-800-FDA-1088. Where should I keep my medicine? Keep  out of the reach of children. Store at room temperature between 15 and 30 degrees C (59 and 86 degrees F). Throw away any unused medicine after the expiration date. NOTE: This sheet is a summary. It may not cover all possible information. If you have questions about this medicine, talk to your doctor, pharmacist, or health care provider.  2018 Elsevier/Gold Standard (2014-03-10 09:22:51)  Bacterial Vaginosis Bacterial vaginosis is a vaginal infection that occurs when the normal balance of bacteria in the vagina is disrupted. It results from an overgrowth of certain bacteria. This is the most common vaginal infection among women ages 21-44. Because bacterial vaginosis increases your risk for STIs (sexually transmitted infections), getting treated can help reduce your risk for chlamydia, gonorrhea, herpes, and HIV (human immunodeficiency virus). Treatment is also important for preventing complications in pregnant women, because this condition can cause an early (premature) delivery. What are the causes? This condition is caused by an increase in harmful bacteria that are normally present in small amounts in the vagina. However, the reason that the condition develops is not fully understood. What increases the risk? The following factors may make you more likely to develop this condition:  Having a new sexual partner or multiple sexual partners.  Having unprotected sex.  Douching.  Having an intrauterine device (IUD).  Smoking.  Drug and alcohol abuse.  Taking certain antibiotic medicines.  Being pregnant.  You cannot get bacterial vaginosis from toilet seats, bedding, swimming pools, or contact with objects around you. What are the signs or symptoms? Symptoms of this  condition include:  Grey or white vaginal discharge. The discharge can also be watery or foamy.  A fish-like odor with discharge, especially after sexual intercourse or during menstruation.  Itching in and around the  vagina.  Burning or pain with urination.  Some women with bacterial vaginosis have no signs or symptoms. How is this diagnosed? This condition is diagnosed based on:  Your medical history.  A physical exam of the vagina.  Testing a sample of vaginal fluid under a microscope to look for a large amount of bad bacteria or abnormal cells. Your health care provider may use a cotton swab or a small wooden spatula to collect the sample.  How is this treated? This condition is treated with antibiotics. These may be given as a pill, a vaginal cream, or a medicine that is put into the vagina (suppository). If the condition comes back after treatment, a second round of antibiotics may be needed. Follow these instructions at home: Medicines  Take over-the-counter and prescription medicines only as told by your health care provider.  Take or use your antibiotic as told by your health care provider. Do not stop taking or using the antibiotic even if you start to feel better. General instructions  If you have a female sexual partner, tell her that you have a vaginal infection. She should see her health care provider and be treated if she has symptoms. If you have a female sexual partner, he does not need treatment.  During treatment: ? Avoid sexual activity until you finish treatment. ? Do not douche. ? Avoid alcohol as directed by your health care provider. ? Avoid breastfeeding as directed by your health care provider.  Drink enough water and fluids to keep your urine clear or pale yellow.  Keep the area around your vagina and rectum clean. ? Wash the area daily with warm water. ? Wipe yourself from front to back after using the toilet.  Keep all follow-up visits as told by your health care provider. This is important. How is this prevented?  Do not douche.  Wash the outside of your vagina with warm water only.  Use protection when having sex. This includes latex condoms and dental  dams.  Limit how many sexual partners you have. To help prevent bacterial vaginosis, it is best to have sex with just one partner (monogamous).  Make sure you and your sexual partner are tested for STIs.  Wear cotton or cotton-lined underwear.  Avoid wearing tight pants and pantyhose, especially during summer.  Limit the amount of alcohol that you drink.  Do not use any products that contain nicotine or tobacco, such as cigarettes and e-cigarettes. If you need help quitting, ask your health care provider.  Do not use illegal drugs. Where to find more information:  Centers for Disease Control and Prevention: AppraiserFraud.fi  American Sexual Health Association (ASHA): www.ashastd.org  [place] Department of Health and Financial controller, Office on Women's Health: DustingSprays.pl or SecuritiesCard.it Contact a health care provider if:  Your symptoms do not improve, even after treatment.  You have more discharge or pain when urinating.  You have a fever.  You have pain in your abdomen.  You have pain during sex.  You have vaginal bleeding between periods. Summary  Bacterial vaginosis is a vaginal infection that occurs when the normal balance of bacteria in the vagina is disrupted.  Because bacterial vaginosis increases your risk for STIs (sexually transmitted infections), getting treated can help reduce your risk for chlamydia, gonorrhea, herpes,  and HIV (human immunodeficiency virus). Treatment is also important for preventing complications in pregnant women, because the condition can cause an early (premature) delivery.  This condition is treated with antibiotic medicines. These may be given as a pill, a vaginal cream, or a medicine that is put into the vagina (suppository). This information is not intended to replace advice given to you by your health care provider. Make sure you discuss any questions you have with your health care  provider. Document Released: 03/26/2005 Document Revised: 12/10/2015 Document Reviewed: 12/10/2015 Elsevier Interactive Patient Education  2017 [employer].

## 2016-09-14 NOTE — Progress Notes (Signed)
Autumn Stuart January 28, 1956 [id number]   History:    61 y.o.  for annual gyn exam with a complaint of a slight vaginal discharge. She is in a monogamous relationship. Last year she had postcoital bleeding had endometrial biopsy which was benign but she did have a cervical polyp which was removed which was benign. She has never been on any hormone replacement therapy. Her PCP is treating her for osteoporosis for which she's on Fosamax 70 mg every weekly. Patient with no previous history of any abnormal Pap smears. Patient still has not had her colonoscopy and her PCP is making arrangements. Her PCP is currently treating her for vitamin D deficiency.  Past medical history,surgical history, family history and social history were all reviewed and documented in the EPIC chart.  Gynecologic History No LMP recorded. Patient is postmenopausal. Contraception: post menopausal status Last Pap2017. Results were: normal Last [id number]. Results were: normal  Obstetric History OB History  Gravida Para Term Preterm AB Living  2 1 1   1 1   SAB TAB Ectopic Multiple Live Births      1   1    # Outcome Date GA Lbr Len/2nd Weight Sex Delivery Anes PTL Lv  2 Ectopic           1 Term      Vag-Spont   LIV       ROS: A ROS was performed and pertinent positives and negatives are included in the history.  GENERAL: No fevers or chills. HEENT: No change in vision, no earache, sore throat or sinus congestion. NECK: No pain or stiffness. CARDIOVASCULAR: No chest pain or pressure. No palpitations. PULMONARY: No shortness of breath, cough or wheeze. GASTROINTESTINAL: No abdominal pain, nausea, vomiting or diarrhea, melena or bright red blood per rectum. GENITOURINARY: No urinary frequency, urgency, hesitancy or dysuria. MUSCULOSKELETAL: No joint or muscle pain, no back pain, no recent trauma. DERMATOLOGIC: No rash, no itching, no lesions. ENDOCRINE: No polyuria, polydipsia, no heat or cold intolerance. No recent  change in weight. HEMATOLOGICAL: No anemia or easy bruising or bleeding. NEUROLOGIC: No headache, seizures, numbness, tingling or weakness. PSYCHIATRIC: No depression, no loss of interest in normal activity or change in sleep pattern.     Exam: chaperone present  BP 130/80   Ht 5\' 1"  (1.549 m)   Wt 147 lb (66.7 kg)   BMI 27.78 kg/m   Body mass index is 27.78 kg/m.  General appearance : Well developed well nourished female. No acute distress HEENT: Eyes: no retinal hemorrhage or exudates,  Neck supple, trachea midline, no carotid bruits, no thyroidmegaly Lungs: Clear to auscultation, no rhonchi or wheezes, or rib retractions  Heart: Regular rate and rhythm, no murmurs or gallops Breast:Examined in sitting and supine position were symmetrical in appearance, no palpable masses or tenderness,  no skin retraction, no nipple inversion, no nipple discharge, no skin discoloration, no axillary or supraclavicular lymphadenopathy Abdomen: no palpable masses or tenderness, no rebound or guarding Extremities: no edema or skin discoloration or tenderness  Pelvic:  Bartholin, Urethra, Skene Glands: Within normal limits             Vagina: No gross lesions or discharge  Cervix: No gross lesions or discharge  UteAnteverted, normal size, shape and consistency, non-tender and mobile  Adnexa  Without masses or tenderness  Anus and perineum  normal   Rectovaginal  normal sphincter tone without palpated masses or tenderness  HemPCP will provide   wet prep many clue cells and too numerous to count bacteria  Assessment/Plan:  61 y.o. female for annual exawith clinical evidence of bacterial vaginosis. She had a prescription refill for Cleocin vaginal cream which she will apply daily at bedtime for one week. For prophylaxis have recommended she use refresh probiotic tablet to take orally daily. Pap smear not indicated this year. Requisition to schedule her mammogram was provided. She'll be  speaking with her PCP in the next couple weeks who will be coordinate her colonoscopy and her next bone density study. We discussed importance of calcium vitamin D and weightbearing exercise for osteoporosis prevention. For vaginal atrophy will discuss using Vagifem 10 g tablet twice a week. Risk benefits and pros and cons were discussed and literature information was provided.  An additional 10 minutes was spent discussing treatment for bacterial vaginosis and recurrence as well as prophylaxis and as well as for vaginal atrophy and hormone replacement therapy   Terrance Mass MD, 12:03 PM 09/14/2016  Who was complaining of a slight vaginal discharge. she is in a monogamous relationship

## 2016-09-28 ENCOUNTER — Ambulatory Visit
Admission: RE | Admit: 2016-09-28 | Discharge: 2016-09-28 | Disposition: A | Discharge status: Home / Self Care | Payer: BLUE CROSS/BLUE SHIELD | Source: Ambulatory Visit | Attending: Gynecology | Admitting: Gynecology

## 2016-09-28 DIAGNOSIS — Z1231 Encounter for screening mammogram for malignant neoplasm of breast: Secondary | ICD-10-CM

## 2017-03-26 ENCOUNTER — Other Ambulatory Visit: Payer: Self-pay | Admitting: Family

## 2017-03-26 DIAGNOSIS — E2839 Other primary ovarian failure: Secondary | ICD-10-CM

## 2017-04-17 ENCOUNTER — Ambulatory Visit
Admission: RE | Admit: 2017-04-17 | Discharge: 2017-04-17 | Disposition: A | Discharge status: Home / Self Care | Payer: BLUE CROSS/BLUE SHIELD | Source: Ambulatory Visit | Attending: Family | Admitting: Family

## 2017-04-17 DIAGNOSIS — E2839 Other primary ovarian failure: Secondary | ICD-10-CM

## 2017-09-17 ENCOUNTER — Encounter: Payer: Self-pay | Admitting: Obstetrics & Gynecology

## 2017-09-17 ENCOUNTER — Telehealth: Payer: Self-pay | Admitting: *Deleted

## 2017-09-17 ENCOUNTER — Ambulatory Visit: Payer: BLUE CROSS/BLUE SHIELD | Admitting: Obstetrics & Gynecology

## 2017-09-17 VITALS — BP 136/88 | Ht 60.5 in | Wt 150.0 lb

## 2017-09-17 DIAGNOSIS — Z1151 Encounter for screening for human papillomavirus (HPV): Secondary | ICD-10-CM | POA: Diagnosis not present

## 2017-09-17 DIAGNOSIS — Z01419 Encounter for gynecological examination (general) (routine) without abnormal findings: Secondary | ICD-10-CM | POA: Diagnosis not present

## 2017-09-17 DIAGNOSIS — Z780 Asymptomatic menopausal state: Secondary | ICD-10-CM

## 2017-09-17 DIAGNOSIS — Z113 Encounter for screening for infections with a predominantly sexual mode of transmission: Secondary | ICD-10-CM

## 2017-09-17 DIAGNOSIS — N952 Postmenopausal atrophic vaginitis: Secondary | ICD-10-CM | POA: Diagnosis not present

## 2017-09-17 DIAGNOSIS — B9689 Other specified bacterial agents as the cause of diseases classified elsewhere: Secondary | ICD-10-CM

## 2017-09-17 DIAGNOSIS — N760 Acute vaginitis: Secondary | ICD-10-CM | POA: Diagnosis not present

## 2017-09-17 LAB — WET PREP FOR TRICH, YEAST, CLUE

## 2017-09-17 MED ORDER — ESTRADIOL 10 MCG VA TABS: 1.0000 | ORAL_TABLET | VAGINAL | 4 refills | Status: AC

## 2017-09-17 MED ORDER — TINIDAZOLE 500 MG PO TABS: 2.0000 g | ORAL_TABLET | Freq: Every day | ORAL | 0 refills | Status: AC

## 2017-09-17 NOTE — Telephone Encounter (Signed)
Patient called and left message in triage voicemail asking if husband need medication due to infection from [place] today. I called back and left on her voicemail husband doesn't need medication

## 2017-09-17 NOTE — Progress Notes (Signed)
Autumn Stuart 1955/10/21 [id number]   History:    62 y.o. G2P1A1L1 Separated.  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  RP:  Established patient presenting for annual gyn exam   HPI: Menopause, well on no HRT.  No postmenopausal bleeding.  No pelvic pain.  complaints of a vaginal discharge with odor.  No vaginal itching or vulvar itching.  Sexually active, no pain with intercourse.  Urine and bowel movements normal.  Breasts normal.  Body mass index 28.81.  Health labs with family physician.  Past medical history,surgical history, family history and social history were all reviewed and documented in the EPIC chart.  Gynecologic History No LMP recorded. Patient is postmenopausal. Contraception: post menopausal status Last Pap: 08/2015. Results were: negative, HR HPV neg Last mammogram: 79892. Results were: Negative Bone Density: 04/2017 Normal Colonoscopy: Never  Obstetric History OB History  Gravida Para Term Preterm AB Living  2 1 1   1 1   SAB TAB Ectopic Multiple Live Births      1   1    # Outcome Date GA Lbr Len/2nd Weight Sex Delivery Anes PTL Lv  2 Ectopic           1 Term      Vag-Spont   LIV     ROS: A ROS was performed and pertinent positives and negatives are included in the history.  GENERAL: No fevers or chills. HEENT: No change in vision, no earache, sore throat or sinus congestion. NECK: No pain or stiffness. CARDIOVASCULAR: No chest pain or pressure. No palpitations. PULMONARY: No shortness of breath, cough or wheeze. GASTROINTESTINAL: No abdominal pain, nausea, vomiting or diarrhea, melena or bright red blood per rectum. GENITOURINARY: No urinary frequency, urgency, hesitancy or dysuria. MUSCULOSKELETAL: No joint or muscle pain, no back pain, no recent trauma. DERMATOLOGIC: No rash, no itching, no lesions. ENDOCRINE: No polyuria, polydipsia, no heat or cold intolerance. No recent change in weight. HEMATOLOGICAL: No anemia or easy bruising or bleeding. NEUROLOGIC: No headache,  seizures, numbness, tingling or weakness. PSYCHIATRIC: No depression, no loss of interest in normal activity or change in sleep pattern.     Exam:   BP 136/88   Ht 5' 0.5" (1.537 m)   Wt 150 lb (68 kg)   BMI 28.81 kg/m   Body mass index is 28.81 kg/m.  General appearance : Well developed well nourished female. No acute distress HEENT: Eyes: no retinal hemorrhage or exudates,  Neck supple, trachea midline, no carotid bruits, no thyroidmegaly Lungs: Clear to auscultation, no rhonchi or wheezes, or rib retractions  Heart: Regular rate and rhythm, no murmurs or gallops Breast:Examined in sitting and supine position were symmetrical in appearance, no palpable masses or tenderness,  no skin retraction, no nipple inversion, no nipple discharge, no skin discoloration, no axillary or supraclavicular lymphadenopathy Abdomen: no palpable masses or tenderness, no rebound or guarding Extremities: no edema or skin discoloration or tenderness  Pelvic: Vulva: Normal             Vagina: No gross lesions or discharge  Cervix: No gross lesions or discharge.  Pap/HR HPV.  Gono-Chlam done.  Wet prep done.  Uterus  AV, normal size, shape and consistency, non-tender and mobile  Adnexa  Without masses or tenderness  Anus: Normal   Assessment/Plan:  62 y.o. female for annual exam   1. Encounter for routine gynecological examination with Papanicolaou smear of cervix Normal gynecologic exam except for increased discharge.  Pap with high-risk HPV done.  Breast exam  normal.  Health labs with family physician.  Schedule screening Mammo now.  Organize screening Colonoscopy.  Recommend increase fitness activities with aerobic 5 times a week and weightlifting every 2 days.  Low calorie/carb diet recommended for mild weight loss as BMI is 28.81.  2. Menopause present Well on no HRT.  No PMB.    3. Post-menopausal atrophic vaginitis Dryness with IC.  Vagifem usage/risks/benefits reviewed.  No contraindication.   Prescription sent to pharmacy.  4. Screen for STD (sexually transmitted disease) Strict condom use strongly recommended. - HIV antibody (with reflex) - RPR - Hepatitis C Antibody - Hepatitis B Surface AntiGEN  5. Bacterial vaginosis Bacterial vaginosis clinically and confirmed on wet prep.  Treat with Tinidazole.  Usage, risks, benefits reviewed.  Prescription sent to pharmacy. - WET PREP FOR [place], YEAST, CLUE  Other orders - Estradiol 10 MCG TABS vaginal tablet; Place 1 tablet (10 mcg total) vaginally 2 (two) times a week. - tinidazole (TINDAMAX) 500 MG tablet; Take 4 tablets (2,000 mg total) by mouth daily for 2 days.  Counseling on above issues >50% x 10 minutes.  Princess Bruins MD, 11:56 AM 09/17/2017

## 2017-09-18 LAB — PAP IG, CT-NG NAA, HPV HIGH-RISK
C. TRACHOMATIS RNA, TMA: NOT DETECTED
HPV DNA HIGH RISK: NOT DETECTED
N. gonorrhoeae RNA, TMA: NOT DETECTED

## 2017-09-18 LAB — RPR: RPR Ser Ql: NONREACTIVE

## 2017-09-18 LAB — HEPATITIS C ANTIBODY
Hepatitis C Ab: NONREACTIVE
SIGNAL TO CUT-OFF: 0.02 (ref <1.00)

## 2017-09-18 LAB — HEPATITIS B SURFACE ANTIGEN: HEP B S AG: NONREACTIVE

## 2017-09-18 LAB — HIV ANTIBODY (ROUTINE TESTING W REFLEX): HIV 1&2 Ab, 4th Generation: NONREACTIVE

## 2017-09-19 ENCOUNTER — Other Ambulatory Visit: Payer: Self-pay | Admitting: Internal Medicine

## 2017-09-19 DIAGNOSIS — Z1231 Encounter for screening mammogram for malignant neoplasm of breast: Secondary | ICD-10-CM

## 2017-09-21 ENCOUNTER — Encounter: Payer: Self-pay | Admitting: Obstetrics & Gynecology

## 2017-09-21 NOTE — Patient Instructions (Addendum)
1. Encounter for routine gynecological examination with Papanicolaou smear of cervix Normal gynecologic exam except for increased discharge.  Pap with high-risk HPV done.  Breast exam normal.  Health labs with family physician.  Schedule screening Mammo now.  Organize screening Colonoscopy.  Recommend increase fitness activities with aerobic 5 times a week and weightlifting every 2 days.  Low calorie/carb diet recommended for mild weight loss as BMI is 28.81.  2. Menopause present Well on no HRT.  No PMB.    3. Post-menopausal atrophic vaginitis Dryness with IC.  Vagifem usage/risks/benefits reviewed.  No contraindication.  Prescription sent to pharmacy.  4. Screen for STD (sexually transmitted disease) Strict condom use strongly recommended. - HIV antibody (with reflex) - RPR - Hepatitis C Antibody - Hepatitis B Surface AntiGEN  5. Bacterial vaginosis Bacterial vaginosis clinically and confirmed on wet prep.  Treat with Tinidazole.  Usage, risks, benefits reviewed.  Prescription sent to pharmacy. - WET PREP FOR [place], YEAST, CLUE  Other orders - Estradiol 10 MCG TABS vaginal tablet; Place 1 tablet (10 mcg total) vaginally 2 (two) times a week. - tinidazole (TINDAMAX) 500 MG tablet; Take 4 tablets (2,000 mg total) by mouth daily for 2 days.  Autumn Stuart, it was a pleasure meeting you today!  I will inform you of your results as soon as they are available.

## 2017-10-11 ENCOUNTER — Ambulatory Visit
Admission: RE | Admit: 2017-10-11 | Discharge: 2017-10-11 | Disposition: A | Discharge status: Home / Self Care | Payer: BLUE CROSS/BLUE SHIELD | Source: Ambulatory Visit | Attending: Internal Medicine | Admitting: Internal Medicine

## 2017-10-11 DIAGNOSIS — Z1231 Encounter for screening mammogram for malignant neoplasm of breast: Secondary | ICD-10-CM

## 2018-04-10 ENCOUNTER — Encounter: Payer: Self-pay | Admitting: Emergency Medicine

## 2018-04-10 ENCOUNTER — Ambulatory Visit
Admission: EM | Admit: 2018-04-10 | Discharge: 2018-04-10 | Disposition: A | Discharge status: Home / Self Care | Payer: BLUE CROSS/BLUE SHIELD | Attending: Physician Assistant | Admitting: Physician Assistant

## 2018-04-10 DIAGNOSIS — I10 Essential (primary) hypertension: Secondary | ICD-10-CM | POA: Diagnosis not present

## 2018-04-10 DIAGNOSIS — Z87891 Personal history of nicotine dependence: Secondary | ICD-10-CM | POA: Diagnosis not present

## 2018-04-10 MED ORDER — LISINOPRIL 5 MG PO TABS: 5.0000 mg | ORAL_TABLET | Freq: Every day | ORAL | 0 refills | Status: AC

## 2018-04-10 MED ORDER — AMLODIPINE BESYLATE 5 MG PO TABS: 5.0000 mg | ORAL_TABLET | Freq: Every day | ORAL | 0 refills | Status: AC

## 2018-04-10 NOTE — ED Notes (Signed)
Patient able to ambulate independently.  Original prescription for Lisinopril patient refused.  States she had a negative reaction to it "years ago".  PA made aware and prescription sent for Amlodipine

## 2018-04-10 NOTE — Discharge Instructions (Signed)
See your Physician for recheck next week  °

## 2018-04-10 NOTE — ED Notes (Signed)
Patient able to ambulate independently  

## 2018-04-10 NOTE — ED Notes (Signed)
When reviewing d/c packet patient states she does not

## 2018-04-10 NOTE — ED Provider Notes (Signed)
[facility]    CSN: [id number] Arrival date & time: 04/10/18  1703     History   Chief Complaint Chief Complaint  Patient presents with  . Hypertension    HPI Autumn Stuart is a 63 y.o. female.   The history is provided by the patient. No language interpreter was used.  Hypertension  This is a new problem. The current episode started yesterday. The problem occurs constantly. The problem has been gradually worsening. Nothing aggravates the symptoms. Nothing relieves the symptoms. She has tried nothing for the symptoms.  Pt reports her blood pressure is elevated.  Pt reports she is currently on hygroton.  Pt reports she has been eating bacon and thinks it may have caused increase.  Pt is also on acyclovir for shingles.  Pt reports pain is controlled with tramadol   Past Medical History:  Diagnosis Date  . Arthritis   . Carpal tunnel syndrome   . High cholesterol   . Hypertension   . Yeast infection     Patient Active Problem List   Diagnosis Date Noted  . Intramural leiomyoma of uterus 09/01/2015  . Essential hypertension, benign 12/03/2013  . Hyperlipidemia 12/03/2013  . Osteoporosis, unspecified 12/03/2013    Past Surgical History:  Procedure Laterality Date  . ECTOPIC PREGNANCY SURGERY      OB History    Gravida  2   Para  1   Term  1   Preterm      AB  1   Living  1     SAB      TAB      Ectopic  1   Multiple      Live Births  1            Home Medications    Prior to Admission medications   Medication Sig Start Date End Date Taking? Authorizing Provider  alendronate (FOSAMAX) 70 MG tablet Take 70 mg by mouth every Monday. Take with a full glass of water on an empty stomach.    Yes [provider]  chlorthalidone (HYGROTON) 25 MG tablet Take 25 mg by mouth daily.   Yes [provider]  diclofenac sodium (VOLTAREN) 1 % GEL Apply 2 g topically 4 (four) times daily as needed (for pain).    Yes [provider]  Estradiol 10 MCG TABS vaginal tablet Place 1 tablet (10 mcg total) vaginally 2 (two) times a week. 09/19/17  Yes Princess Bruins, MD  loratadine (CLARITIN) 10 MG tablet Take 10 mg by mouth daily.   Yes [provider]  Multiple Vitamin (MULTIVITAMIN WITH MINERALS) TABS tablet Take 1 tablet by mouth daily.   Yes [provider]  rosuvastatin (CRESTOR) 10 MG tablet Take 10 mg by mouth daily.   Yes [provider]  Vitamin D, Ergocalciferol, (DRISDOL) 50000 units CAPS capsule as directed. 08/15/16  Yes [provider]  ibuprofen (ADVIL,MOTRIN) 600 MG tablet Take 1 tablet (600 mg total) by mouth every 6 (six) hours as needed. 07/10/16   Dorie Rank, MD  lisinopril (PRINIVIL,ZESTRIL) 5 MG tablet Take 1 tablet (5 mg total) by mouth daily. 04/10/18   Fransico Meadow, PA-C    Family History Family History  Problem Relation Age of Onset  . Heart disease Mother   . Hypertension Mother   . High Cholesterol Mother   . Thyroid disease Mother   . Heart disease Father   . Diabetes Sister   . High Cholesterol Sister   .  Hypertension Sister   . Thyroid disease Sister   . Cancer Brother   . Hypertension Brother   . High Cholesterol Brother   . Breast cancer Cousin   . Breast cancer Other     Social History Social History   Tobacco Use  . Smoking status: Former Research scientist (life sciences)  . Smokeless tobacco: Former Systems developer    Quit date: 04/09/1993  Substance Use Topics  . Alcohol use: No    Alcohol/week: 0.0 standard drinks  . Drug use: No     Allergies   Patient has no known allergies.   Review of Systems Review of Systems  All other systems reviewed and are negative.    Physical Exam Triage Vital Signs ED Triage Vitals  Enc Vitals Group     BP 04/10/18 1707 (!) 174/97     Pulse Rate 04/10/18 1707 68     Resp 04/10/18 1707 18     Temp 04/10/18 1707 98.7 F (37.1 C)     Temp Source 04/10/18 1707 Oral     SpO2 04/10/18 1707 98 %     Weight --       Height --      Head Circumference --      Peak Flow --      Pain Score 04/10/18 1708 2     Pain Loc --      Pain Edu? --      Excl. in [place]? --    No data found.  Updated Vital Signs BP (!) 174/97 (BP Location: Left Arm)   Pulse 68   Temp 98.7 F (37.1 C) (Oral)   Resp 18   SpO2 98%   Visual Acuity Right Eye Distance:   Left Eye Distance:   Bilateral Distance:    Right Eye Near:   Left Eye Near:    Bilateral Near:     Physical Exam Vitals signs and nursing note reviewed.  Constitutional:      Appearance: Normal appearance.  HENT:     Right Ear: Tympanic membrane normal.     Left Ear: Tympanic membrane normal.     Nose: Nose normal.  Neck:     Musculoskeletal: Normal range of motion.  Cardiovascular:     Rate and Rhythm: Normal rate.  Pulmonary:     Effort: Pulmonary effort is normal.  Abdominal:     General: Abdomen is flat.  Skin:    General: Skin is warm.  Neurological:     General: No focal deficit present.     Mental Status: She is alert.  Psychiatric:        Mood and Affect: Mood normal.      UC Treatments / Results  Labs (all labs ordered are listed, but only abnormal results are displayed) Labs Reviewed - No data to display  EKG None  Radiology No results found.  Procedures Procedures (including critical care time)  Medications Ordered in UC Medications - No data to display  Initial Impression / Assessment and Plan / UC Course  I have reviewed the triage vital signs and the nursing notes.  Pertinent labs & imaging results that were available during my care of the patient were reviewed by me and considered in my medical decision making (see chart for details).    MDM   Pt not given lisinopril.  Pt reports it made her feel bad in the past.  Pt given rx for norvasc. Pt advised to see her MD for recheck   Final Clinical Impressions(s) /  UC Diagnoses   Final diagnoses:  Essential hypertension     Discharge Instructions     See  your Physician for recheck next week    ED Prescriptions    Medication Sig Dispense Auth. Provider   lisinopril (PRINIVIL,ZESTRIL) 5 MG tablet Take 1 tablet (5 mg total) by mouth daily. 30 tablet Makia Bossi K, PA-C   amLODipine (NORVASC) 5 MG tablet Take 1 tablet (5 mg total) by mouth daily. 30 tablet Fransico Meadow, [place]     Controlled Substance Prescriptions [place] Controlled Substance Registry consulted? Not Applicable  An After Visit Summary was printed and given to the patient.    Fransico Meadow, [place] 04/10/18 [phone]

## 2018-04-10 NOTE — ED Triage Notes (Signed)
Pt presents to [facility] for assessment of elevated BP.  Hx of HTN, states she has been taking all of her meds.  Pt c/o sudden onset of dizziness, which is normal for her when her BP is high.  BP at home 175/110.

## 2018-09-09 IMAGING — MG DIGITAL SCREENING BILATERAL MAMMOGRAM WITH TOMO AND CAD
6 of 10 series · 6 of 30 positions shown · non-contrast
Comparison: Previous exam(s).

CLINICAL DATA: Screening.

EXAM:
DIGITAL SCREENING BILATERAL MAMMOGRAM WITH TOMO AND CAD

[L CC synth-2D]
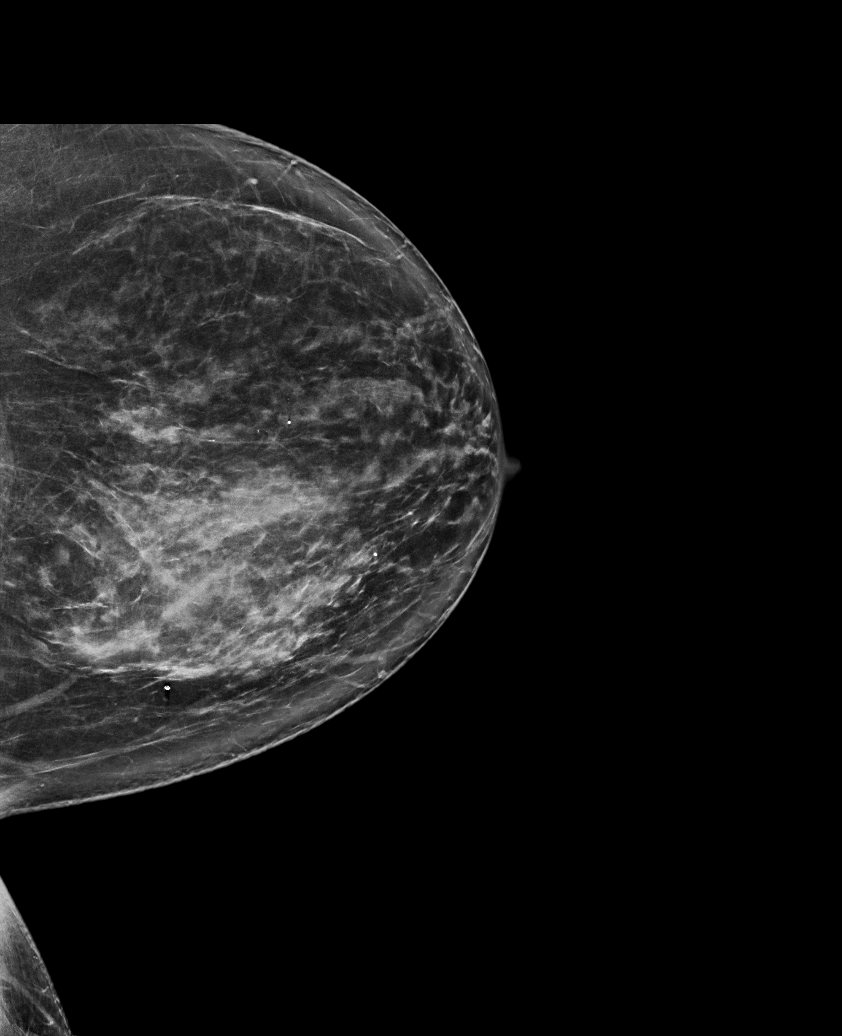

[L MLO synth-2D]
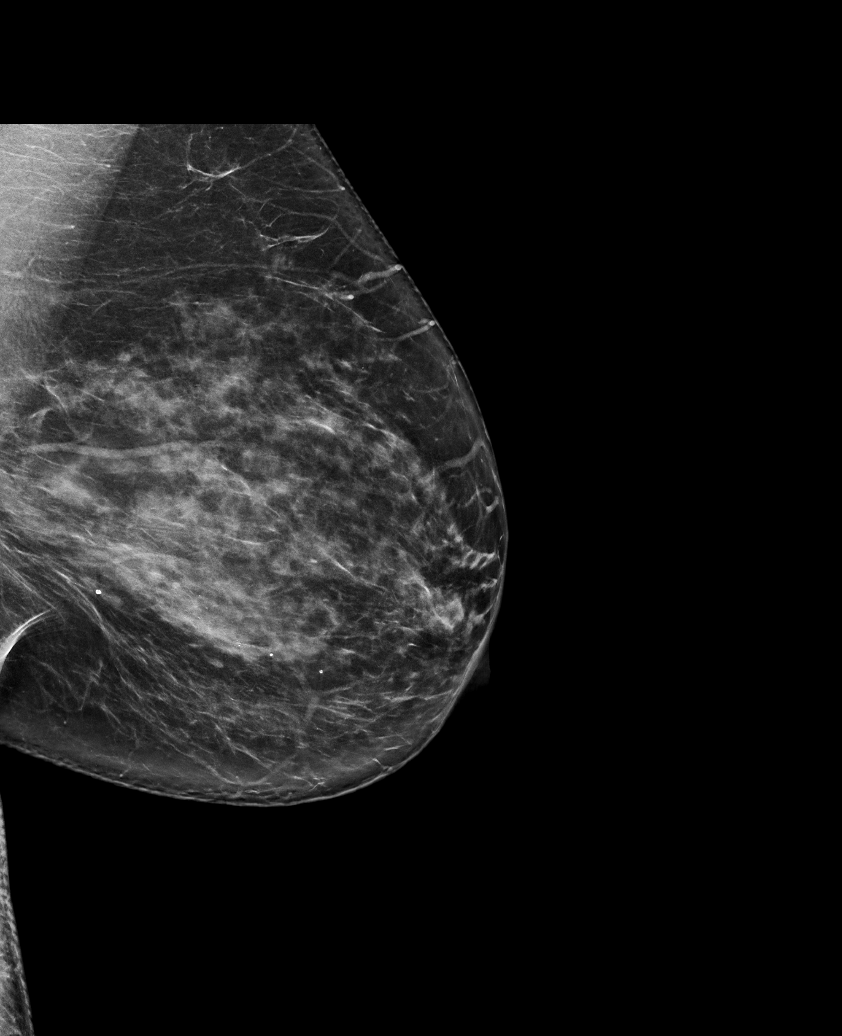

[R MLO synth-2D (1 of 2)]
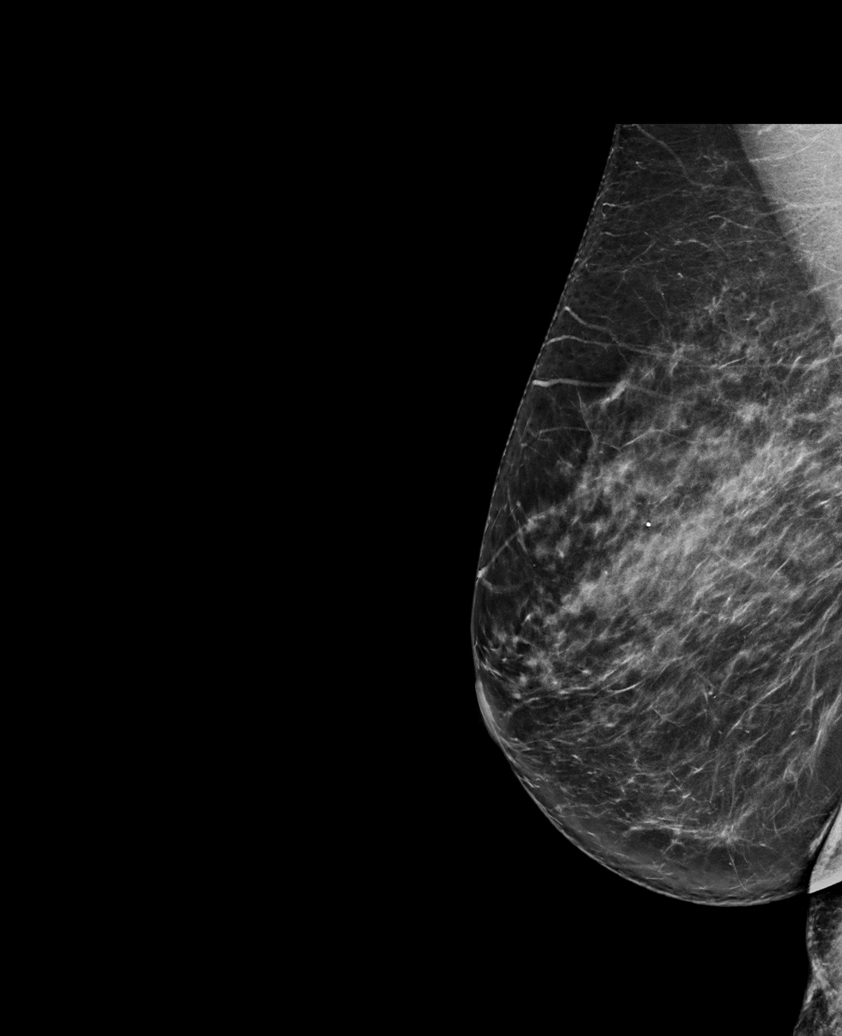

[R MLO synth-2D (2 of 2)]
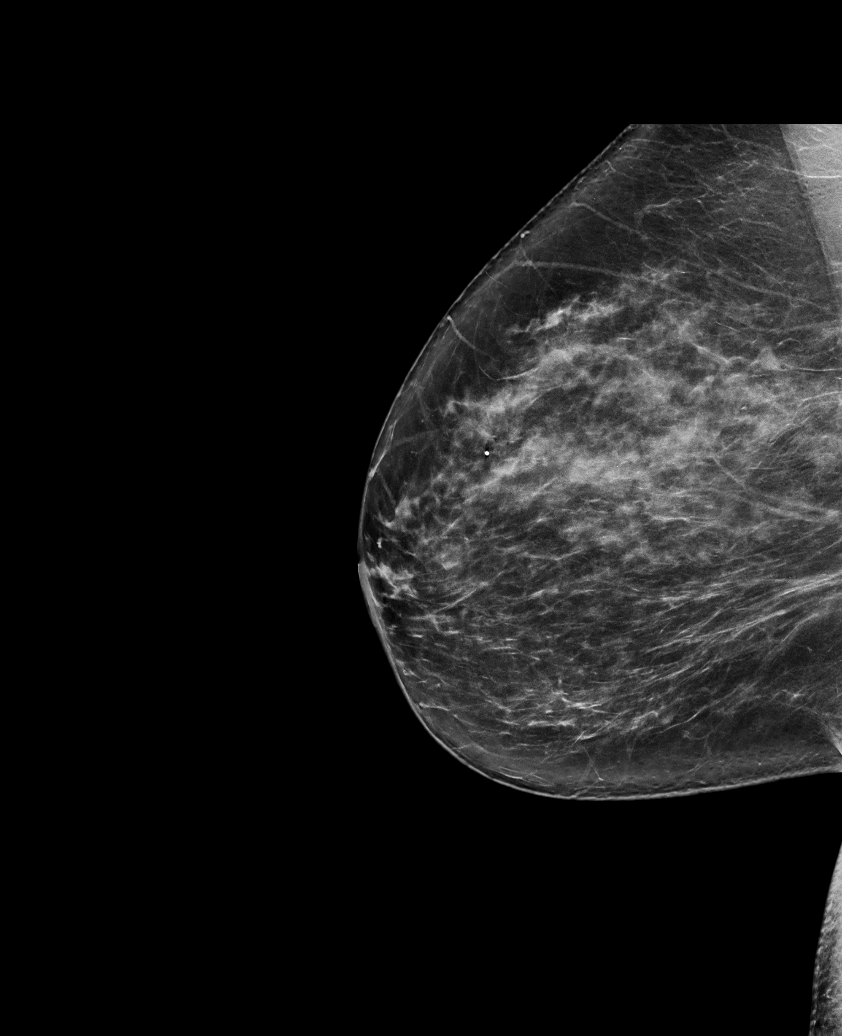

[R CC synth-2D]
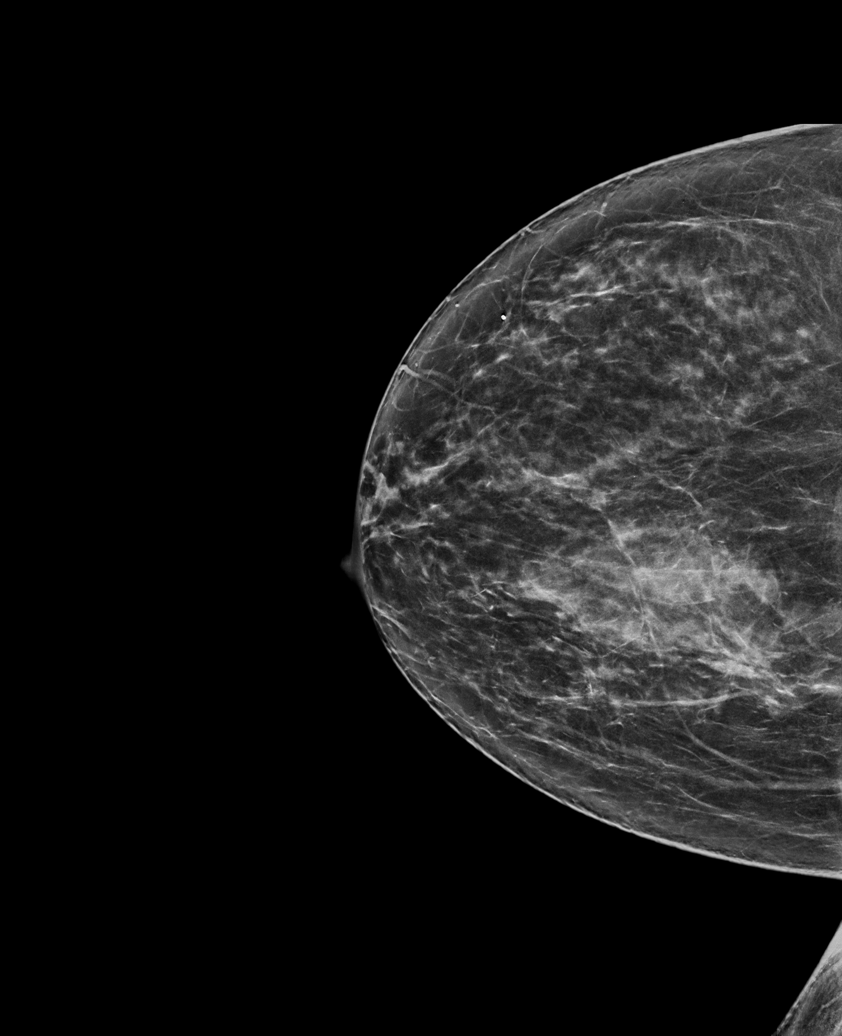

[R MLO tomo · tomo slice 40/79.0]
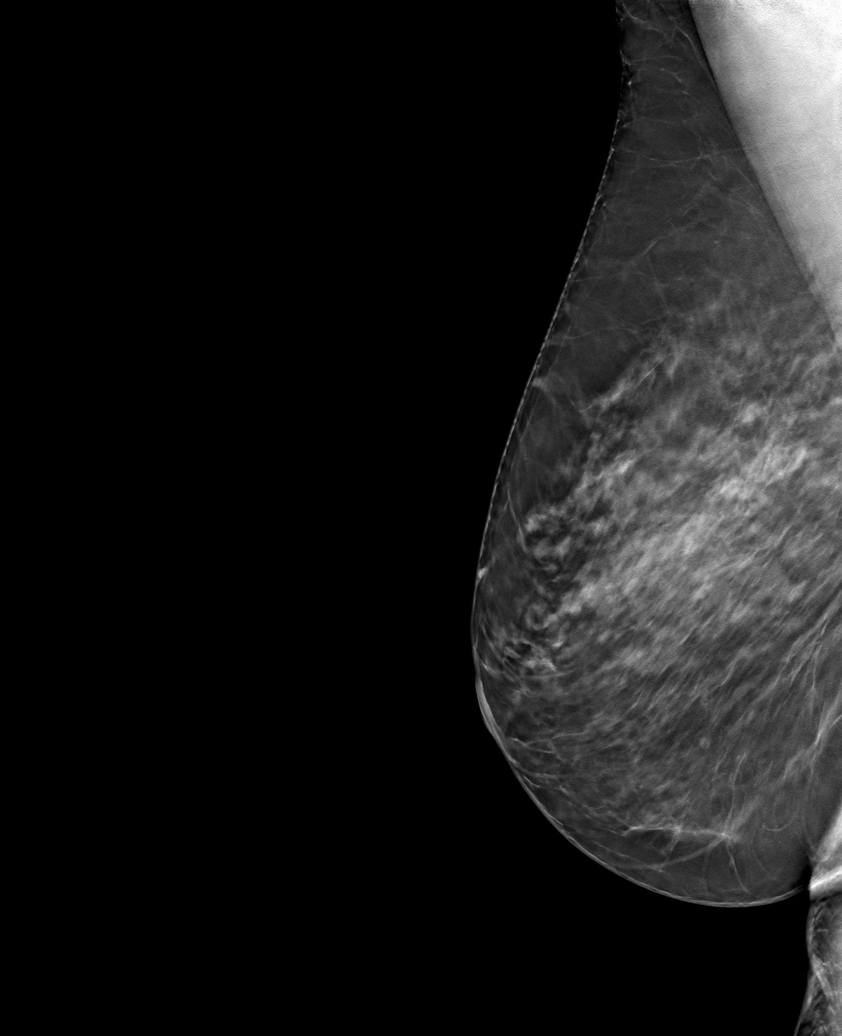

[6 of 30 positions shown; findings below may reference images not displayed]

ACR Breast Density Category c: The breast tissue is heterogeneously
dense, which may obscure small masses.
FINDINGS: There are no findings suspicious for malignancy. Images were
processed with CAD.
IMPRESSION: No mammographic evidence of malignancy. A result letter of this
screening mammogram will be mailed directly to the patient.

RECOMMENDATION:
Screening mammogram in one year. (Code:FT-U-LHB)

BI-RADS CATEGORY  1: Negative.

## 2018-09-19 ENCOUNTER — Encounter: Payer: BLUE CROSS/BLUE SHIELD | Admitting: Obstetrics & Gynecology

## 2019-03-27 ENCOUNTER — Other Ambulatory Visit: Payer: Self-pay

## 2019-03-27 DIAGNOSIS — Z20822 Contact with and (suspected) exposure to covid-19: Secondary | ICD-10-CM

## 2019-03-28 LAB — NOVEL CORONAVIRUS, NAA: SARS-CoV-2, NAA: NOT DETECTED

## 2019-04-08 ENCOUNTER — Telehealth: Payer: Self-pay | Admitting: General Practice

## 2019-04-08 NOTE — Telephone Encounter (Signed)
Negative COVID results given. Patient results "NOT Detected." Caller expressed understanding. ° °

## 2019-11-20 ENCOUNTER — Ambulatory Visit: Payer: Self-pay | Admitting: *Deleted

## 2019-11-20 NOTE — Telephone Encounter (Signed)
Patient"s husband called agent to ask if his wife should get tested for covid tomorrow due to exposure today at work and should patient come home around family. Advised agent to review with husband and patient to test 5-7 days after exposure and to use precautions around the immediate family with social distancing 6 feet apart and wear a mask around the family and monitor cleanliness of surfaces and isolate as much as possible  If patient develops symptoms before 5 days get tested.
